# Patient Record
Sex: Female | Born: 1995 | Race: Black or African American | Hispanic: No | Marital: Single | Smoking: Never smoker
Health system: Southern US, Community
[De-identification: ages and names within clinical notes are randomized; demographics above are authoritative.]

## PROBLEM LIST (undated history)

## (undated) DIAGNOSIS — J45909 Unspecified asthma, uncomplicated: Secondary | ICD-10-CM

## (undated) DIAGNOSIS — IMO0002 Reserved for concepts with insufficient information to code with codable children: Secondary | ICD-10-CM

## (undated) DIAGNOSIS — N390 Urinary tract infection, site not specified: Secondary | ICD-10-CM

## (undated) DIAGNOSIS — F4310 Post-traumatic stress disorder, unspecified: Secondary | ICD-10-CM

## (undated) DIAGNOSIS — N939 Abnormal uterine and vaginal bleeding, unspecified: Secondary | ICD-10-CM

## (undated) DIAGNOSIS — T7840XA Allergy, unspecified, initial encounter: Secondary | ICD-10-CM

## (undated) DIAGNOSIS — E669 Obesity, unspecified: Secondary | ICD-10-CM

## (undated) DIAGNOSIS — F259 Schizoaffective disorder, unspecified: Secondary | ICD-10-CM

## (undated) HISTORY — PX: NO PAST SURGERIES: SHX2092

## (undated) HISTORY — DX: Post-traumatic stress disorder, unspecified: F43.10

## (undated) HISTORY — DX: Abnormal uterine and vaginal bleeding, unspecified: N93.9

## (undated) HISTORY — PX: WISDOM TOOTH EXTRACTION: SHX21

---
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
## 2015-11-14 ENCOUNTER — Encounter (HOSPITAL_COMMUNITY): Payer: Self-pay | Admitting: Emergency Medicine

## 2015-11-14 ENCOUNTER — Ambulatory Visit (HOSPITAL_COMMUNITY)
Admission: EM | Admit: 2015-11-14 | Discharge: 2015-11-14 | Disposition: A | Discharge status: Home / Self Care | Payer: Medicare Other

## 2015-11-14 DIAGNOSIS — B373 Candidiasis of vulva and vagina: Secondary | ICD-10-CM

## 2015-11-14 DIAGNOSIS — N898 Other specified noninflammatory disorders of vagina: Secondary | ICD-10-CM

## 2015-11-14 DIAGNOSIS — B3731 Acute candidiasis of vulva and vagina: Secondary | ICD-10-CM

## 2015-11-14 LAB — POCT PREGNANCY, URINE: PREG TEST UR: NEGATIVE

## 2015-11-14 LAB — POCT URINALYSIS DIP (DEVICE)
BILIRUBIN URINE: NEGATIVE
GLUCOSE, UA: NEGATIVE mg/dL
Hgb urine dipstick: NEGATIVE
KETONES UR: NEGATIVE mg/dL
Leukocytes, UA: NEGATIVE
Nitrite: NEGATIVE
Protein, ur: NEGATIVE mg/dL
SPECIFIC GRAVITY, URINE: 1.025 (ref 1.005–1.030)
Urobilinogen, UA: 0.2 mg/dL (ref 0.0–1.0)
pH: 6.5 (ref 5.0–8.0)

## 2015-11-14 MED ORDER — FLUCONAZOLE 200 MG PO TABS: 200.0000 mg | ORAL_TABLET | Freq: Every day | ORAL | 0 refills | Status: AC

## 2015-11-14 NOTE — ED Triage Notes (Signed)
The patient presented to the [facility] with a complaint of a vaginal discharge and lower back pain that started 3 days ago. The patient denied any dysuria.

## 2016-04-29 ENCOUNTER — Emergency Department (HOSPITAL_COMMUNITY)
Admission: EM | Admit: 2016-04-29 | Discharge: 2016-04-29 | Disposition: A | Discharge status: Home / Self Care | Payer: Medicare Other | Attending: Emergency Medicine | Admitting: Emergency Medicine

## 2016-04-29 ENCOUNTER — Encounter (HOSPITAL_COMMUNITY): Payer: Self-pay | Admitting: Emergency Medicine

## 2016-04-29 DIAGNOSIS — R519 Headache, unspecified: Secondary | ICD-10-CM

## 2016-04-29 DIAGNOSIS — J069 Acute upper respiratory infection, unspecified: Secondary | ICD-10-CM | POA: Diagnosis not present

## 2016-04-29 DIAGNOSIS — J45909 Unspecified asthma, uncomplicated: Secondary | ICD-10-CM | POA: Diagnosis not present

## 2016-04-29 DIAGNOSIS — Z79899 Other long term (current) drug therapy: Secondary | ICD-10-CM | POA: Diagnosis not present

## 2016-04-29 DIAGNOSIS — R51 Headache: Secondary | ICD-10-CM | POA: Diagnosis present

## 2016-04-29 LAB — CBC WITH DIFFERENTIAL/PLATELET
Basophils Absolute: 0 10*3/uL (ref 0.0–0.1)
Basophils Relative: 0 %
EOS ABS: 0.2 10*3/uL (ref 0.0–0.7)
Eosinophils Relative: 2 %
HEMATOCRIT: 33.6 % — AB (ref 36.0–46.0)
HEMOGLOBIN: 10.7 g/dL — AB (ref 12.0–15.0)
LYMPHS ABS: 2.6 10*3/uL (ref 0.7–4.0)
Lymphocytes Relative: 30 %
MCH: 23 pg — AB (ref 26.0–34.0)
MCHC: 31.8 g/dL (ref 30.0–36.0)
MCV: 72.3 fL — ABNORMAL LOW (ref 78.0–100.0)
MONO ABS: 0.4 10*3/uL (ref 0.1–1.0)
MONOS PCT: 4 %
NEUTROS ABS: 5.5 10*3/uL (ref 1.7–7.7)
NEUTROS PCT: 64 %
Platelets: 357 10*3/uL (ref 150–400)
RBC: 4.65 MIL/uL (ref 3.87–5.11)
RDW: 18.1 % — ABNORMAL HIGH (ref 11.5–15.5)
WBC: 8.6 10*3/uL (ref 4.0–10.5)

## 2016-04-29 LAB — I-STAT BETA HCG BLOOD, ED (MC, WL, AP ONLY): I-stat hCG, quantitative: <5 m[IU]/mL (ref <5)

## 2016-04-29 LAB — BASIC METABOLIC PANEL
Anion gap: 7 (ref 5–15)
BUN: 8 mg/dL (ref 6–20)
CHLORIDE: 105 mmol/L (ref 101–111)
CO2: 26 mmol/L (ref 22–32)
CREATININE: 0.84 mg/dL (ref 0.44–1.00)
Calcium: 8.6 mg/dL — ABNORMAL LOW (ref 8.9–10.3)
GFR calc Af Amer: >60 mL/min (ref >60)
GFR calc non Af Amer: >60 mL/min (ref >60)
Glucose, Bld: 101 mg/dL — ABNORMAL HIGH (ref 65–99)
Potassium: 3.7 mmol/L (ref 3.5–5.1)
Sodium: 138 mmol/L (ref 135–145)

## 2016-04-29 MED ORDER — METOCLOPRAMIDE HCL 5 MG/ML IJ SOLN
10.0000 mg | Freq: Once | INTRAMUSCULAR | Status: AC
  Administered 2016-04-29: 10 mg via INTRAVENOUS
  Filled 2016-04-29: qty 2

## 2016-04-29 MED ORDER — KETOROLAC TROMETHAMINE 30 MG/ML IJ SOLN
30.0000 mg | Freq: Once | INTRAMUSCULAR | Status: AC
  Administered 2016-04-29: 30 mg via INTRAVENOUS
  Filled 2016-04-29: qty 1

## 2016-04-29 MED ORDER — SODIUM CHLORIDE 0.9 % IV BOLUS (SEPSIS)
1000.0000 mL | Freq: Once | INTRAVENOUS | Status: AC
  Administered 2016-04-29: 1000 mL via INTRAVENOUS

## 2016-04-29 NOTE — Discharge Instructions (Signed)
Take ibuprofen 600 mg every 6 hours and/or tylenol 650 mg every 6 hours as needed for headache. You can also try some over the counter decongestants. Avoid taking these medications around the clock for more than 2-3 days, only take as needed  Return for worsening symptoms, including confusion, escalating pain, intractable vomiting or any other symptoms concerning to you.  This is likely a viral infection, which is usually worse first 3-5 days and get better over 1-2 week period.

## 2016-04-29 NOTE — ED Provider Notes (Signed)
[facility]-EMERGENCY DEPT Provider Note   CSN: [id number] Arrival date & time: 04/29/16  0915     History   Chief Complaint Chief Complaint  Patient presents with  . Headache    HPI Tricia Scott is a 21 y.o. female.  The history is provided by the patient.  Headache   This is a new problem. The current episode started 2 days ago. The problem occurs constantly. The problem has been gradually worsening. The headache is associated with nothing. The pain is located in the frontal region. The quality of the pain is described as sharp and throbbing. The pain is at a severity of 9/10. The pain is severe. The pain radiates to the face. Associated symptoms include nausea. Pertinent negatives include no anorexia, no fever, no malaise/fatigue, no chest pressure, no palpitations, no syncope, no shortness of breath and no vomiting. She has tried nothing for the symptoms.    21 year old female who presents with headache. Onset of headache 2 days ago, mild in nature and gradually worsening over the course of the past 2 days. Since he was nausea but no vomiting, no fevers, chills. Has had recent exposure to sick contacts with recent viral illness. She has had associating sinus congestion, mild nonproductive cough, mild sore throat, eye lateral ear fullness. No vision or speech symptoms, numbness or weakness.  Past Medical History:  Diagnosis Date  . Allergy   . Asthma   . Schizo-affective psychosis (HCC)   . Sprains and strains of ankle and foot 12/15/2011   Right side; occurred on Wednesday 12/12/2011  . Urinary tract infection     Patient Active Problem List   Diagnosis Date Noted  . 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 Generalized anxiety disorder 04/22/2011  . Oppositional defiant disorder 04/22/2011    Past Surgical History:  Procedure Laterality Date  . NO PAST SURGERIES      OB History    No data available       Home  Medications    Prior to Admission medications   Medication Sig Start Date End Date Taking? Authorizing Provider  albuterol (ACCUNEB) 0.63 MG/3ML nebulizer solution Take 1 ampule by nebulization every 6 (six) hours as needed for wheezing or shortness of breath.     Historical Provider, MD  Asenapine Maleate (SAPHRIS) 10 MG SUBL Place 10 mg under the tongue at bedtime.    Historical Provider, MD  Bisacodyl (LAXATIVE PO) Take by mouth.    Historical Provider, MD  cetirizine (ZYRTEC) 10 MG tablet Take 10 mg by mouth at bedtime.     Historical Provider, MD  haloperidol (HALDOL) 1 MG tablet Take 1 mg by mouth 2 (two) times daily.    Historical Provider, MD  lamoTRIgine (LAMICTAL) 150 MG tablet Take 150 mg by mouth at bedtime.    Historical Provider, MD  Melatonin 10 MG TABS Take 10 mg by mouth at bedtime.    Historical Provider, MD  Vilazodone HCl (VIIBRYD) 40 MG TABS Take 1 tablet (40 mg total) by mouth at bedtime. For anxiety and depression 04/27/11   Chauncey Mann, MD    Family History Family History  Problem Relation Age of Onset  . Diabetes Mother   . Diabetes type II Mother   . Fibromyalgia Mother   . Hypertension Mother   . Mental illness Father   . Mental illness Sister     Social History Social History  Substance Use Topics  . Smoking status:  Never Smoker  . Smokeless tobacco: Never Used  . Alcohol use No     Allergies   Aripiprazole; Risperidone; and Seroquel [quetiapine fumerate]   Review of Systems Review of Systems  Constitutional: Negative for fever and malaise/fatigue.  Respiratory: Negative for shortness of breath.   Cardiovascular: Negative for palpitations and syncope.  Gastrointestinal: Positive for nausea. Negative for anorexia and vomiting.  Neurological: Positive for headaches.  All other systems reviewed and are negative.    Physical Exam Updated Vital Signs BP 118/78 (BP Location: Left Arm)   Pulse 80   Temp 98.5 F (36.9 C) (Oral)   Resp 14    Ht 5' (1.524 m)   Wt 200 lb (90.7 kg)   SpO2 100%   BMI 39.06 kg/m   Physical Exam Physical Exam  Nursing note and vitals reviewed. Constitutional: Well developed, well nourished, non-toxic, and in no acute distress Head: Normocephalic and atraumatic.  Mouth/Throat: Oropharynx is clear and moist.  Ears: bilateral TMs full w/o effusion Neck: Normal range of motion. Neck supple. no meningismus Cardiovascular: Normal rate and regular rhythm.   Pulmonary/Chest: Effort normal and breath sounds normal.  Abdominal: Soft. There is no tenderness. There is no rebound and no guarding.  Musculoskeletal: Normal range of motion.  Neurological: Alert, no facial droop, fluent speech, moves all extremities, PERRL, sensation to light touch in tact throughout Skin: Skin is warm and dry. no rash Psychiatric: Cooperative   ED Treatments / Results  Labs (all labs ordered are listed, but only abnormal results are displayed) Labs Reviewed  CBC WITH DIFFERENTIAL/PLATELET - Abnormal; Notable for the following:       Result Value   Hemoglobin 10.7 (*)    HCT 33.6 (*)    MCV 72.3 (*)    MCH 23.0 (*)    RDW 18.1 (*)    All other components within normal limits  BASIC METABOLIC PANEL - Abnormal; Notable for the following:    Glucose, Bld 101 (*)    Calcium 8.6 (*)    All other components within normal limits  I-STAT BETA HCG BLOOD, ED ([facility], [facility], [facility] ONLY)    EKG  EKG Interpretation None       Radiology No results found.  Procedures Procedures (including critical care time)  Medications Ordered in ED Medications  sodium chloride 0.9 % bolus 1,000 mL (1,000 mLs Intravenous New Bag/Given 04/29/16 1229)  ketorolac (TORADOL) 30 MG/ML injection 30 mg (30 mg Intravenous Given 04/29/16 1229)  metoCLOPramide (REGLAN) injection 10 mg (10 mg Intravenous Given 04/29/16 1229)     Initial Impression / Assessment and Plan / ED Course  I have reviewed the triage vital signs and the nursing  notes.  Pertinent labs & imaging results that were available during my care of the patient were reviewed by me and considered in my medical decision making (see chart for details).  Clinical Course     21 year old female who presents with 2 days of headache in the setting of likely viral upper respiratory infection. No meningismus or fever to suggest meningitis. Headache not of sudden onset maximal intensity. Did not suddenly come on with exertion, cough, sneezing, but is made worse with cough. No suggestive of SAH. She is otherwise, non-toxic and in no acute distress. Neuro exam is in tact. Low suspicion for serious intracranial process at this time.  Headache gone after ivf, toradol, reglan. Blood work reassuring. Likely viral URI/sinus infection. Discussed supportive care.   Strict return and follow-up instructions reviewed. She  expressed understanding of all discharge instructions and felt comfortable with the plan of care.   Final Clinical Impressions(s) / ED Diagnoses   Final diagnoses:  Frontal headache  Viral upper respiratory infection    New Prescriptions New Prescriptions   No medications on file     Lavera Guiseana Duo Ariella Voit, MD 04/29/16 1315

## 2016-04-29 NOTE — ED Triage Notes (Addendum)
Pt c/o headache x 2 days worse with sneezing and laying flat, congestion onset last night, today feeling weakness. Friend told pt that she thought pt had the flu. No emesis, diarrhea. Sinuses tender to palpation. TMs cloudy. Conjunctiva diffusely pink.

## 2016-08-27 ENCOUNTER — Encounter (HOSPITAL_COMMUNITY): Payer: Self-pay | Admitting: Emergency Medicine

## 2016-08-27 ENCOUNTER — Emergency Department (HOSPITAL_COMMUNITY)
Admission: EM | Admit: 2016-08-27 | Discharge: 2016-08-27 | Disposition: A | Discharge status: Home / Self Care | Payer: No Typology Code available for payment source | Attending: Emergency Medicine | Admitting: Emergency Medicine

## 2016-08-27 DIAGNOSIS — Y999 Unspecified external cause status: Secondary | ICD-10-CM | POA: Insufficient documentation

## 2016-08-27 DIAGNOSIS — Y9389 Activity, other specified: Secondary | ICD-10-CM | POA: Diagnosis not present

## 2016-08-27 DIAGNOSIS — Y92410 Unspecified street and highway as the place of occurrence of the external cause: Secondary | ICD-10-CM | POA: Insufficient documentation

## 2016-08-27 DIAGNOSIS — S161XXA Strain of muscle, fascia and tendon at neck level, initial encounter: Secondary | ICD-10-CM

## 2016-08-27 DIAGNOSIS — S199XXA Unspecified injury of neck, initial encounter: Secondary | ICD-10-CM | POA: Diagnosis present

## 2016-08-27 DIAGNOSIS — J45909 Unspecified asthma, uncomplicated: Secondary | ICD-10-CM | POA: Diagnosis not present

## 2016-08-27 HISTORY — DX: Obesity, unspecified: E66.9

## 2016-08-27 MED ORDER — NAPROXEN 500 MG PO TABS: 500.0000 mg | ORAL_TABLET | Freq: Two times a day (BID) | ORAL | 0 refills | Status: DC | PRN

## 2016-08-27 MED ORDER — METHOCARBAMOL 500 MG PO TABS: 500.0000 mg | ORAL_TABLET | Freq: Two times a day (BID) | ORAL | 0 refills | Status: DC | PRN
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
## 2017-05-08 ENCOUNTER — Ambulatory Visit (HOSPITAL_COMMUNITY)
Admission: EM | Admit: 2017-05-08 | Discharge: 2017-05-08 | Disposition: A | Discharge status: Home / Self Care | Payer: Medicare Other | Attending: Family Medicine | Admitting: Family Medicine

## 2017-05-08 ENCOUNTER — Encounter (HOSPITAL_COMMUNITY): Payer: Self-pay | Admitting: Emergency Medicine

## 2017-05-08 ENCOUNTER — Other Ambulatory Visit: Payer: Self-pay

## 2017-05-08 DIAGNOSIS — J069 Acute upper respiratory infection, unspecified: Secondary | ICD-10-CM | POA: Diagnosis not present

## 2017-05-08 MED ORDER — CROMOLYN SODIUM 5.2 MG/ACT NA AERS: 1.0000 | INHALATION_SPRAY | Freq: Four times a day (QID) | NASAL | 12 refills | Status: DC

## 2017-05-08 MED ORDER — ACETAMINOPHEN 325 MG PO TABS
ORAL_TABLET | ORAL | Status: AC
  Filled 2017-05-08: qty 2

## 2017-05-08 MED ORDER — ACETAMINOPHEN 500 MG PO TABS: 500.0000 mg | ORAL_TABLET | Freq: Four times a day (QID) | ORAL | 0 refills | Status: DC | PRN

## 2017-05-08 MED ORDER — ACETAMINOPHEN 325 MG PO TABS
650.0000 mg | ORAL_TABLET | Freq: Once | ORAL | Status: AC
  Administered 2017-05-08: 650 mg via ORAL

## 2017-05-08 NOTE — Discharge Instructions (Signed)
Push fluids to ensure adequate hydration and keep secretions thin.  Tylenol and/or ibuprofen as needed for pain or fevers.  Nasal spray up to 4 times a day for congestion symptoms.  May take daily zyrtec as well.  If symptoms worsen or do not improve in the next week to return to be seen or to follow up with your PCP.

## 2017-05-08 NOTE — ED Triage Notes (Signed)
Pt reports nasal congestion, nasal drainage, sore throat and drainage from her ears bilaterally.  She also reports cold chills and night sweats, nausea and some abdominal discomfort.  Pt has not taken any OTC medications for her symptoms.

## 2017-05-08 NOTE — ED Provider Notes (Signed)
[facility]    CSN: [id number] Arrival date & time: 05/08/17  1012     History   Chief Complaint Chief Complaint  Patient presents with  . URI    HPI [facility] is a 22 y.o. female.   [name] presents with complaints of nasal drainage occasional abdominal pain and nausea with decreased appetite, subjective fevers, mild headache, ear drainage which started 4 days ago. Without cough, sore throat, vomiting, diarrhea, known ill contacts, rash. Has not taken any medications for symptoms. History of asthma as well as anxiety/depression, takes daily viibryd.    ROS per HPI.       Past Medical History:  Diagnosis Date  . Allergy   . Asthma   . Obesity   . Schizo-affective psychosis (HCC)   . Sprains and strains of ankle and foot 12/15/2011   Right side; occurred on Wednesday 12/12/2011  . Urinary tract infection     Patient Active Problem List   Diagnosis Date Noted  . 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 Generalized anxiety disorder 04/22/2011  . Oppositional defiant disorder 04/22/2011    Past Surgical History:  Procedure Laterality Date  . NO PAST SURGERIES      OB History    No data available       Home Medications    Prior to Admission medications   Medication Sig Start Date End Date Taking? Authorizing Provider  Vilazodone HCl (VIIBRYD) 40 MG TABS Take 1 tablet (40 mg total) by mouth at bedtime. For anxiety and depression 04/27/11  Yes Chauncey Mann, MD  acetaminophen (TYLENOL) 500 MG tablet Take 1 tablet (500 mg total) by mouth every 6 (six) hours as needed. 05/08/17   Georgetta Haber, NP  albuterol (ACCUNEB) 0.63 MG/3ML nebulizer solution Take 1 ampule by nebulization every 6 (six) hours as needed for wheezing or shortness of breath.     [provider]  Asenapine Maleate (SAPHRIS) 10 MG SUBL Place 10 mg under the tongue at bedtime.    [provider]  Bisacodyl  (LAXATIVE PO) Take by mouth.    [provider]  cetirizine (ZYRTEC) 10 MG tablet Take 10 mg by mouth at bedtime.     [provider]  cromolyn (NASALCROM) 5.2 MG/ACT nasal spray Place 1 spray into both nostrils 4 (four) times daily. 05/08/17   Linus Mako B, NP  haloperidol (HALDOL) 1 MG tablet Take 1 mg by mouth 2 (two) times daily.    [provider]  lamoTRIgine (LAMICTAL) 150 MG tablet Take 150 mg by mouth at bedtime.    [provider]  Melatonin 10 MG TABS Take 10 mg by mouth at bedtime.    [provider]    Family History Family History  Problem Relation Age of Onset  . Diabetes Mother   . Diabetes type II Mother   . Fibromyalgia Mother   . Hypertension Mother   . Mental illness Father   . Mental illness Sister     Social History Social History   Tobacco Use  . Smoking status: Never Smoker  . Smokeless tobacco: Never Used  Substance Use Topics  . Alcohol use: No  . Drug use: No     Allergies   Aripiprazole; Risperidone; and Seroquel [quetiapine fumerate]   Review of Systems Review of Systems   Physical Exam Triage Vital Signs ED Triage Vitals  Enc Vitals Group     BP 05/08/17 1041  121/89     Pulse Rate 05/08/17 1041 72     Resp --      Temp 05/08/17 1041 98.2 F (36.8 C)     Temp Source 05/08/17 1041 Oral     SpO2 05/08/17 1041 100 %     Weight --      Height --      Head Circumference --      Peak Flow --      Pain Score 05/08/17 1036 5     Pain Loc --      Pain Edu? --      Excl. in GC? --    No data found.  Updated Vital Signs BP 121/89 (BP Location: Left Arm)   Pulse 72   Temp 98.2 F (36.8 C) (Oral)   LMP 05/01/2017 (Approximate)   SpO2 100%   Visual Acuity Right Eye Distance:   Left Eye Distance:   Bilateral Distance:    Right Eye Near:   Left Eye Near:    Bilateral Near:     Physical Exam  Constitutional: She is oriented to person, place, and time. She appears well-developed  and well-nourished. No distress.  HENT:  Head: Normocephalic and atraumatic.  Right Ear: Tympanic membrane, external ear and ear canal normal.  Left Ear: External ear and ear canal normal. Tympanic membrane is erythematous.  Nose: Nose normal.  Mouth/Throat: Uvula is midline, oropharynx is clear and moist and mucous membranes are normal. No tonsillar exudate.  Mild erythema to left TM; without drainage or perforation to either TM   Eyes: Conjunctivae and EOM are normal. Pupils are equal, round, and reactive to light.  Cardiovascular: Normal rate, regular rhythm and normal heart sounds.  Pulmonary/Chest: Effort normal and breath sounds normal.  Abdominal: Soft. There is no tenderness.  Lymphadenopathy:    She has no cervical adenopathy.  Neurological: She is alert and oriented to person, place, and time.  Skin: Skin is warm and dry.     UC Treatments / Results  Labs (all labs ordered are listed, but only abnormal results are displayed) Labs Reviewed - No data to display  EKG  EKG Interpretation None       Radiology No results found.  Procedures Procedures (including critical care time)  Medications Ordered in UC Medications  acetaminophen (TYLENOL) tablet 650 mg (not administered)     Initial Impression / Assessment and Plan / UC Course  I have reviewed the triage vital signs and the nursing notes.  Pertinent labs & imaging results that were available during my care of the patient were reviewed by me and considered in my medical decision making (see chart for details).     Benign physical findings on exam. Non toxic in appearance, vitals stable. History and physical consistent with viral illness.  Supportive cares recommended. If symptoms worsen or do not improve in the next week to return to be seen or to follow up with PCP.  Patient verbalized understanding and agreeable to plan.    Final Clinical Impressions(s) / UC Diagnoses   Final diagnoses:  Viral upper  respiratory tract infection    ED Discharge Orders        Ordered    cromolyn (NASALCROM) 5.2 MG/ACT nasal spray  4 times daily     05/08/17 1106    acetaminophen (TYLENOL) 500 MG tablet  Every 6 hours PRN     05/08/17 1106       Controlled Substance Prescriptions [place] Controlled Substance Registry consulted?  Not Applicable   Georgetta HaberBurky, Natalie B, NP 05/08/17 [phone]

## 2018-01-12 ENCOUNTER — Encounter (HOSPITAL_COMMUNITY): Payer: Self-pay | Admitting: Emergency Medicine

## 2018-01-12 ENCOUNTER — Emergency Department (HOSPITAL_COMMUNITY)
Admission: EM | Admit: 2018-01-12 | Discharge: 2018-01-12 | Disposition: A | Discharge status: Home / Self Care | Payer: Medicare Other | Attending: Emergency Medicine | Admitting: Emergency Medicine

## 2018-01-12 ENCOUNTER — Other Ambulatory Visit: Payer: Self-pay

## 2018-01-12 DIAGNOSIS — J45909 Unspecified asthma, uncomplicated: Secondary | ICD-10-CM | POA: Insufficient documentation

## 2018-01-12 DIAGNOSIS — R599 Enlarged lymph nodes, unspecified: Secondary | ICD-10-CM | POA: Insufficient documentation

## 2018-01-12 DIAGNOSIS — J069 Acute upper respiratory infection, unspecified: Secondary | ICD-10-CM | POA: Diagnosis not present

## 2018-01-12 DIAGNOSIS — Z79899 Other long term (current) drug therapy: Secondary | ICD-10-CM | POA: Insufficient documentation

## 2018-01-12 DIAGNOSIS — R0981 Nasal congestion: Secondary | ICD-10-CM | POA: Diagnosis present

## 2018-01-12 MED ORDER — IBUPROFEN 400 MG PO TABS
600.0000 mg | ORAL_TABLET | Freq: Once | ORAL | Status: AC
  Administered 2018-01-12: 600 mg via ORAL
  Filled 2018-01-12: qty 1

## 2018-01-12 MED ORDER — IBUPROFEN 600 MG PO TABS: 600.0000 mg | ORAL_TABLET | Freq: Four times a day (QID) | ORAL | 0 refills | Status: DC | PRN

## 2018-01-12 MED ORDER — ACETAMINOPHEN 500 MG PO TABS: 500.0000 mg | ORAL_TABLET | Freq: Four times a day (QID) | ORAL | 0 refills | Status: AC | PRN

## 2018-01-12 NOTE — ED Triage Notes (Addendum)
Pt ambulatory to triage via ems c/o pain and ? Knot on left neck area states has been there for a week , sore to touch she states , states it moves aaround a lot she states,  It  She states gives her h/a

## 2018-01-12 NOTE — ED Provider Notes (Signed)
[facility] EMERGENCY DEPARTMENT Provider Note   CSN: [id number] Arrival date & time: 01/12/18  1230     History   Chief Complaint Chief Complaint  Patient presents with  . Torticollis    HPI Farah A Marquis is a 22 y.o. female with history of obesity, asthma, schizoaffective psychosis who presents with a one-week history of nasal congestion, intermittent ear pain, subjective fevers, and a knot to her left anterior shoulder.  She reports not has been painful and radiates pain to her left shoulder.  She has been taking Zyrtec and Tylenol at home.  She is concerned about the knot, however.  Patient denies any chest pain, shortness of breath, abdominal pain, vomiting.  Patient has had some intermittent nausea.  HPI  Past Medical History:  Diagnosis Date  . Allergy   . Asthma   . Obesity   . Schizo-affective psychosis (HCC)   . Sprains and strains of ankle and foot 12/15/2011   Right side; occurred on Wednesday 12/12/2011  . Urinary tract infection     Patient Active Problem List   Diagnosis Date Noted  . 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 Generalized anxiety disorder 04/22/2011  . Oppositional defiant disorder 04/22/2011    Past Surgical History:  Procedure Laterality Date  . NO PAST SURGERIES       OB History   None      Home Medications    Prior to Admission medications   Medication Sig Start Date End Date Taking? Authorizing Provider  acetaminophen (TYLENOL) 500 MG tablet Take 1 tablet (500 mg total) by mouth every 6 (six) hours as needed. 01/12/18   Lecia Esperanza, Waylan Boga, PA-C  albuterol (ACCUNEB) 0.63 MG/3ML nebulizer solution Take 1 ampule by nebulization every 6 (six) hours as needed for wheezing or shortness of breath.     [provider]  Asenapine Maleate (SAPHRIS) 10 MG SUBL Place 10 mg under the tongue at bedtime.    [provider]  Bisacodyl (LAXATIVE PO) Take by  mouth.    [provider]  cetirizine (ZYRTEC) 10 MG tablet Take 10 mg by mouth at bedtime.     [provider]  cromolyn (NASALCROM) 5.2 MG/ACT nasal spray Place 1 spray into both nostrils 4 (four) times daily. 05/08/17   Linus Mako B, NP  haloperidol (HALDOL) 1 MG tablet Take 1 mg by mouth 2 (two) times daily.    [provider]  ibuprofen (ADVIL,MOTRIN) 600 MG tablet Take 1 tablet (600 mg total) by mouth every 6 (six) hours as needed. 01/12/18   Jair Lindblad, Waylan Boga, PA-C  lamoTRIgine (LAMICTAL) 150 MG tablet Take 150 mg by mouth at bedtime.    [provider]  Melatonin 10 MG TABS Take 10 mg by mouth at bedtime.    [provider]  Vilazodone HCl (VIIBRYD) 40 MG TABS Take 1 tablet (40 mg total) by mouth at bedtime. For anxiety and depression 04/27/11   Chauncey Mann, MD    Family History Family History  Problem Relation Age of Onset  . Diabetes Mother   . Diabetes type II Mother   . Fibromyalgia Mother   . Hypertension Mother   . Mental illness Father   . Mental illness Sister     Social History Social History   Tobacco Use  . Smoking status: Never Smoker  . Smokeless tobacco: Never Used  Substance Use Topics  . Alcohol use: No  . Drug  use: No     Allergies   Aripiprazole; Risperidone; and Seroquel [quetiapine fumerate]   Review of Systems Review of Systems  Constitutional: Positive for fever.  HENT: Positive for congestion and ear pain. Negative for sore throat.   Respiratory: Negative for cough and shortness of breath.   Cardiovascular: Negative for chest pain.  Gastrointestinal: Positive for nausea. Negative for abdominal pain and vomiting.  Hematological: Positive for adenopathy.     Physical Exam Updated Vital Signs BP (!) 144/93 (BP Location: Right Arm)   Pulse 98   Temp 98.9 F (37.2 C) (Oral)   Resp 16   SpO2 100%   Physical Exam  Constitutional: She appears well-developed and well-nourished. No  distress.  HENT:  Head: Normocephalic and atraumatic.  Right Ear: Tympanic membrane normal.  Left Ear: Tympanic membrane normal.  Mouth/Throat: Oropharynx is clear and moist and mucous membranes are normal. No oropharyngeal exudate, posterior oropharyngeal edema, posterior oropharyngeal erythema or tonsillar abscesses. Tonsils are 0 on the right. Tonsils are 0 on the left. No tonsillar exudate.  Eyes: Pupils are equal, round, and reactive to light. Conjunctivae are normal. Right eye exhibits no discharge. Left eye exhibits no discharge. No scleral icterus.  Neck: Normal range of motion. Neck supple. No thyromegaly present.    Cardiovascular: Normal rate, regular rhythm, normal heart sounds and intact distal pulses. Exam reveals no gallop and no friction rub.  No murmur heard. Pulmonary/Chest: Effort normal and breath sounds normal. No stridor. No respiratory distress. She has no wheezes. She has no rales.  Abdominal: Soft. Bowel sounds are normal. She exhibits no distension. There is no tenderness. There is no rebound and no guarding.  Musculoskeletal: She exhibits no edema.  Neurological: She is alert. Coordination normal.  Skin: Skin is warm and dry. No rash noted. She is not diaphoretic. No pallor.  Psychiatric: She has a normal mood and affect.  Nursing note and vitals reviewed.    ED Treatments / Results  Labs (all labs ordered are listed, but only abnormal results are displayed) Labs Reviewed - No data to display  EKG None  Radiology No results found.  Procedures Procedures (including critical care time)  Medications Ordered in ED Medications  ibuprofen (ADVIL,MOTRIN) tablet 600 mg (has no administration in time range)     Initial Impression / Assessment and Plan / ED Course  I have reviewed the triage vital signs and the nursing notes.  Pertinent labs & imaging results that were available during my care of the patient were reviewed by me and considered in my  medical decision making (see chart for details).     Patient with tender lymph node, most likely related to patient's URI.  Most likely viral.  Will treat symptomatically at this time with Tylenol, ibuprofen, Zyrtec, heat/ice.  Patient advised to follow-up with PCP or community [facility] wellness center for further evaluation if the lymph node persists after URI symptoms have resolved.  No signs of lymphangitis at this time.  No erythema.  Return precautions discussed.  Patient understands and agrees with plan.  Patient vitals stable throughout ED course and discharged in satisfactory condition.  Final Clinical Impressions(s) / ED Diagnoses   Final diagnoses:  Enlarged lymph node  Viral URI    ED Discharge Orders         Ordered    ibuprofen (ADVIL,MOTRIN) 600 MG tablet  Every 6 hours PRN     01/12/18 1327    acetaminophen (TYLENOL) 500 MG tablet  Every  6 hours PRN     01/12/18 1327           Emi Holes, PA-C 01/12/18 1331    Loren Racer, MD 01/12/18 1550

## 2018-01-12 NOTE — Discharge Instructions (Addendum)
I suspect that you have an enlarged lymph node from the viral upper respiratory infection that should run its course in 7 to 10 days.  Continue taking Zyrtec as prescribed.  You can alternate ibuprofen and Tylenol as prescribed for your pain.  Make sure to take these medications with food. You can use a heating pad over the area to help provide comfort as well.  If your upper respiratory symptoms, such as nasal congestion and ear pain, are resolving and the area on your shoulder is not improving, please follow-up with your primary care doctor or the [facility] for further evaluation and treatment.

## 2018-07-04 ENCOUNTER — Ambulatory Visit: Payer: Medicare Other | Admitting: Internal Medicine

## 2018-09-22 ENCOUNTER — Ambulatory Visit: Payer: Medicare Other | Admitting: Nurse Practitioner

## 2018-09-24 ENCOUNTER — Other Ambulatory Visit: Payer: Self-pay

## 2018-09-24 ENCOUNTER — Encounter: Payer: Self-pay | Admitting: Nurse Practitioner

## 2018-09-24 ENCOUNTER — Ambulatory Visit: Payer: Medicare Other | Attending: Nurse Practitioner | Admitting: Nurse Practitioner

## 2018-09-24 DIAGNOSIS — Z87440 Personal history of urinary (tract) infections: Secondary | ICD-10-CM | POA: Insufficient documentation

## 2018-09-24 DIAGNOSIS — N939 Abnormal uterine and vaginal bleeding, unspecified: Secondary | ICD-10-CM | POA: Diagnosis not present

## 2018-09-24 DIAGNOSIS — J3089 Other allergic rhinitis: Secondary | ICD-10-CM | POA: Diagnosis not present

## 2018-09-24 DIAGNOSIS — Z8249 Family history of ischemic heart disease and other diseases of the circulatory system: Secondary | ICD-10-CM | POA: Diagnosis not present

## 2018-09-24 DIAGNOSIS — Z6841 Body Mass Index (BMI) 40.0 and over, adult: Secondary | ICD-10-CM | POA: Diagnosis not present

## 2018-09-24 DIAGNOSIS — F4310 Post-traumatic stress disorder, unspecified: Secondary | ICD-10-CM | POA: Diagnosis not present

## 2018-09-24 DIAGNOSIS — F331 Major depressive disorder, recurrent, moderate: Secondary | ICD-10-CM

## 2018-09-24 DIAGNOSIS — J45909 Unspecified asthma, uncomplicated: Secondary | ICD-10-CM | POA: Diagnosis not present

## 2018-09-24 DIAGNOSIS — Z818 Family history of other mental and behavioral disorders: Secondary | ICD-10-CM | POA: Insufficient documentation

## 2018-09-24 DIAGNOSIS — Z833 Family history of diabetes mellitus: Secondary | ICD-10-CM | POA: Diagnosis not present

## 2018-09-24 DIAGNOSIS — Z7689 Persons encountering health services in other specified circumstances: Secondary | ICD-10-CM

## 2018-09-24 MED ORDER — CETIRIZINE HCL 10 MG PO TABS: 10.0000 mg | ORAL_TABLET | Freq: Every day | ORAL | 2 refills | Status: DC

## 2018-09-24 MED ORDER — IBUPROFEN 600 MG PO TABS: 600.0000 mg | ORAL_TABLET | Freq: Four times a day (QID) | ORAL | 0 refills | Status: AC | PRN

## 2018-09-24 MED ORDER — CROMOLYN SODIUM 5.2 MG/ACT NA AERS: 1.0000 | INHALATION_SPRAY | Freq: Four times a day (QID) | NASAL | 12 refills | Status: DC

## 2018-09-24 NOTE — Progress Notes (Signed)
80-year virtual Visit via Telephone Note Due to national recommendations of social distancing due to [facility], telehealth visit is felt to be most appropriate for this patient at this time.  I discussed the limitations, risks, security and privacy concerns of performing an evaluation and management service by telephone and the availability of in person appointments. I also discussed with the patient that there may be a patient responsible charge related to this service. The patient expressed understanding and agreed to proceed.    I connected with T[facility] Scott on 09/24/18  at   1:50 PM EDT  EDT by telephone and verified that I am speaking with the correct person using two identifiers.   Consent I discussed the limitations, risks, security and privacy concerns of performing an evaluation and management service by telephone and the availability of in person appointments. I also discussed with the patient that there may be a patient responsible charge related to this service. The patient expressed understanding and agreed to proceed.   Location of Patient: Private  Residence   Location of Provider: [place] [facility] [employer] [facility]    Persons participating in Telemedicine visit: Tricia Rankins FNP-BC [facility]    History of Present Illness: Telemedicine visit for: Establish Care  has a past medical history of Allergy, Asthma, Obesity, PTSD (post-traumatic stress disorder), Schizo-affective psychosis ([facility]), Sprains and strains of ankle and foot (12/15/2011), and Urinary tract infection.    She is a current patient at [facility]. She sees a psychiatrist every 3 months and the therapist every other week.   She has not seen a primary care provider since 2018. Last PAP smear was 2018 and normal per her report. She is not sexually active.  She is requesting a referral to gynecology for recommendations regarding birth control and relief of her  menorrhagia  Asthma  Chronic and well controlled. Sparing use of inhaler.  There is no [facility] Scott on her medication profile.  However she reports to me that she just recently picked up a Tricia inhaler.  She will call back with the name of her [facility].  Aggravating factors to asthma exacerbation: Temperature changes, pollen, dust.    AUB She endorses menorrhagia which has worsened over the past several months. Started her menstrual cycle at the age of 46. Her cycle is 5 days.  Maternal history of mother having fibroids.  Heaviest bleeding on days 1-3. She is not sexually active.  Past Medical History:  Diagnosis Date  . Allergy   . Asthma   . Obesity   . PTSD (post-traumatic stress disorder)   . Schizo-affective psychosis ([facility])   . Sprains and strains of ankle and foot 12/15/2011   Right side; occurred on Wednesday 12/12/2011  . Urinary tract infection     Past Surgical History:  Procedure Laterality Date  . NO PAST SURGERIES    . wisdom tooth removal      Family History  Problem Relation Age of Onset  . Diabetes Mother   . Diabetes type II Mother   . Fibromyalgia Mother   . Hypertension Mother   . Mental illness Father   . Mental illness Sister     Social History   Socioeconomic History  . Marital status: Single    Spouse name: Not on file  . Number of children: Not on file  . Years of education: Not on file  . Highest education level: Not on file  Occupational History  .  Not on file  Social Needs  . Financial resource strain: Not on file  . Food insecurity:    Worry: Not on file    Inability: Not on file  . Transportation needs:    Medical: Not on file    Non-medical: Not on file  Tobacco Use  . Smoking status: Never Smoker  . Smokeless tobacco: Never Used  Substance and Sexual Activity  . Alcohol use: No  . Drug use: No  . Sexual activity: Never    Birth control/protection: None  Lifestyle  . Physical activity:    Days per week: Not on file    Minutes per session:  Not on file  . Stress: Not on file  Relationships  . Social connections:    Talks on phone: Not on file    Gets together: Not on file    Attends religious service: Not on file    Active member of club or organization: Not on file    Attends meetings of clubs or organizations: Not on file    Relationship status: Not on file  Other Topics Concern  . Not on file  Social History Narrative  . Not on file     Observations/Objective: Awake, alert and oriented x 3   Review of Systems  Constitutional: Negative for fever, malaise/fatigue and weight loss.  HENT: Negative.  Negative for nosebleeds.   Eyes: Negative.  Negative for blurred vision, double vision and photophobia.  Respiratory: Negative.  Negative for cough, shortness of breath and wheezing.   Cardiovascular: Negative.  Negative for chest pain, palpitations and leg swelling.  Gastrointestinal: Negative.  Negative for heartburn, nausea and vomiting.  Genitourinary:       AUB  Musculoskeletal: Negative.  Negative for myalgias.  Neurological: Negative.  Negative for dizziness, focal weakness, seizures and headaches.  Endo/Heme/Allergies: Positive for environmental allergies.  Psychiatric/Behavioral: Positive for depression. Negative for substance abuse and suicidal ideas. The patient is nervous/anxious.     Assessment and Plan: Shadow was evaluated today for [facility] patient (initial visit).  Diagnoses and all orders for this visit:  Encounter to establish care  Abnormal uterine bleeding (AUB) -     Ambulatory referral to Gynecology -     ibuprofen (ADVIL) 600 MG tablet; Take 1 tablet (600 mg total) by mouth every 6 (six) hours as needed for headache or cramping.  Moderate recurrent major depression (HCC) [facility]   Allergic rhinitis due to other allergic trigger, unspecified seasonality -     cromolyn (NASALCROM) 5.2 MG/ACT nasal spray; Place 1 spray into both nostrils 4 (four)  times daily. -     cetirizine (ZYRTEC) 10 MG tablet; Take 1 tablet (10 mg total) by mouth at bedtime.   Morbid Obesity Discussed diet and exercise for person with BMI >40. Instructed: You must burn more calories than you eat. Losing 5 percent of your body weight should be considered a success. In the longer term, losing more than 15 percent of your body weight and staying at this weight is an extremely good result. However, keep in mind that even losing 5 percent of your body weight leads to important health benefits, so try not to get discouraged if you're not able to lose more than this. Will recheck weight in 3-6 months.  Follow Up Instructions Return in about 2 months (around 11/24/2018).     I discussed the assessment and treatment plan with the patient. The patient was provided an opportunity  to ask questions and all were answered. The patient agreed with the plan and demonstrated an understanding of the instructions.   The patient was advised to call back or seek an in-person evaluation if the symptoms worsen or if the condition fails to improve as anticipated.  I provided 20 minutes of non-face-to-face time during this encounter including median intraservice time, reviewing previous notes, labs, imaging, medications and explaining diagnosis and management.  Claiborne RiggZelda W Cheree Fowles, FNP-BC

## 2018-10-29 ENCOUNTER — Encounter: Payer: Self-pay | Admitting: Obstetrics & Gynecology

## 2018-10-29 ENCOUNTER — Telehealth (INDEPENDENT_AMBULATORY_CARE_PROVIDER_SITE_OTHER): Payer: Medicare Other | Admitting: Obstetrics & Gynecology

## 2018-10-29 ENCOUNTER — Other Ambulatory Visit: Payer: Self-pay

## 2018-10-29 DIAGNOSIS — N920 Excessive and frequent menstruation with regular cycle: Secondary | ICD-10-CM

## 2018-10-29 NOTE — Progress Notes (Signed)
I connected with  Tricia Scott on 10/29/18 at  3:35 PM EDT by telephone and verified that I am speaking with the correct person using two identifiers.   I discussed the limitations, risks, security and privacy concerns of performing an evaluation and management service by telephone and the availability of in person appointments. I also discussed with the patient that there may be a patient responsible charge related to this service. The patient expressed understanding and agreed to proceed.  Derinda Late, RN 10/29/2018  3:30 PM

## 2018-10-29 NOTE — Progress Notes (Signed)
    TELEHEALTH GYNECOLOGY VIRTUAL VIDEO VISIT ENCOUNTER NOTE  Provider location: [facility] at [facility]   I connected with Tricia Scott on 10/29/18 at  3:35 PM EDT by MyChart Video Encounter at home and verified that I am speaking with the correct person using two identifiers.   I discussed the limitations, risks, security and privacy concerns of performing an evaluation and management service virtually and the availability of in person appointments. I also discussed with the patient that there may be a patient responsible charge related to this service. The patient expressed understanding and agreed to proceed.   History:  Tricia Scott is a 23 y.o. single G18female being evaluated today for heavy painful periods. This    has gotten progressively worse since about March 2020. Her periods last about 7 days, menarche about 48 or 23 years old. Cramping bad started around 24 years old. Periods are generally monthly. When she has the pain she has tried tylenol and IBU. This helps some.    Past Medical History:  Diagnosis Date  . Allergy   . Asthma   . Obesity   . PTSD (post-traumatic stress disorder)   . Schizo-affective psychosis ([place])   . Sprains and strains of ankle and foot 12/15/2011   Right side; occurred on Wednesday 12/12/2011  . Urinary tract infection    Past Surgical History:  Procedure Laterality Date  . NO PAST SURGERIES    . wisdom tooth removal     The following portions of the patient's history were reviewed and updated as appropriate: allergies, current medications, past family history, past medical history, past social history, past surgical history and problem list.     Review of Systems:  Pertinent items noted in HPI and remainder of comprehensive ROS otherwise negative.  Physical Exam:   General:  Alert, oriented and cooperative. Patient appears to be in no acute distress.  Mental Status: Normal mood and affect. Normal behavior. Normal  judgment and thought content.   Respiratory: Normal respiratory effort, no problems with respiration noted  Rest of physical exam deferred due to type of encounter  Labs and Imaging No results found for this or any previous visit (from the past 336 hour(s)). No results found.     Assessment and Plan:     1. Menorrhagia with regular cycle  - US PELVIS TRANSVAGINAL NON-OB (TV ONLY); Future - CBC; Future - TSH; Future     She will have an in person visit when u/s results available Will need a pap smear at that time Offer Gardasil then.   I discussed the assessment and treatment plan with the patient. The patient was provided an opportunity to ask questions and all were answered. The patient agreed with the plan and demonstrated an understanding of the instructions.   The patient was advised to call back or seek an in-person evaluation/go to the ED if the symptoms worsen or if the condition fails to improve as anticipated.  I provided 10 minutes of face-to-face time during this encounter.   Emily Filbert, MD [facility], [place]

## 2018-10-30 ENCOUNTER — Telehealth: Payer: Self-pay | Admitting: Obstetrics & Gynecology

## 2018-10-30 NOTE — Telephone Encounter (Signed)
Called the patient to inform of the upcoming visit. Also informed the patient of the appointment tomorrow as well as the covid19 restrictions. The patient stated she does not have any symptoms.

## 2018-10-31 ENCOUNTER — Other Ambulatory Visit: Payer: Self-pay

## 2018-10-31 ENCOUNTER — Other Ambulatory Visit: Payer: Medicare Other

## 2018-10-31 DIAGNOSIS — N920 Excessive and frequent menstruation with regular cycle: Secondary | ICD-10-CM

## 2018-11-01 LAB — CBC
Hematocrit: 31.3 % — ABNORMAL LOW (ref 34.0–46.6)
Hemoglobin: 8.7 g/dL — ABNORMAL LOW (ref 11.1–15.9)
MCH: 20.2 pg — ABNORMAL LOW (ref 26.6–33.0)
MCHC: 27.8 g/dL — ABNORMAL LOW (ref 31.5–35.7)
MCV: 73 fL — ABNORMAL LOW (ref 79–97)
Platelets: 358 10*3/uL (ref 150–450)
RBC: 4.31 x10E6/uL (ref 3.77–5.28)
RDW: 18.7 % — ABNORMAL HIGH (ref 11.7–15.4)
WBC: 9.4 10*3/uL (ref 3.4–10.8)

## 2018-11-01 LAB — TSH: TSH: 1.34 u[IU]/mL (ref 0.450–4.500)

## 2018-11-03 ENCOUNTER — Other Ambulatory Visit: Payer: Self-pay | Admitting: Obstetrics & Gynecology

## 2018-11-03 ENCOUNTER — Telehealth: Payer: Self-pay | Admitting: Obstetrics & Gynecology

## 2018-11-03 MED ORDER — MEGESTROL ACETATE 40 MG PO TABS: 40.0000 mg | ORAL_TABLET | Freq: Two times a day (BID) | ORAL | 5 refills | Status: DC

## 2018-11-03 NOTE — Progress Notes (Signed)
I spoke with her and told her that she needs fereheme x2. I ordered this.

## 2018-11-03 NOTE — Progress Notes (Signed)
Megace ordered

## 2018-11-03 NOTE — Telephone Encounter (Signed)
I called her and explained her anemia and ordered fereheme x 2.

## 2018-11-06 ENCOUNTER — Other Ambulatory Visit: Payer: Self-pay

## 2018-11-06 ENCOUNTER — Emergency Department (HOSPITAL_COMMUNITY): Payer: Medicare Other

## 2018-11-06 ENCOUNTER — Telehealth: Payer: Self-pay | Admitting: Obstetrics & Gynecology

## 2018-11-06 ENCOUNTER — Emergency Department (HOSPITAL_COMMUNITY)
Admission: EM | Admit: 2018-11-06 | Discharge: 2018-11-06 | Disposition: A | Discharge status: Home / Self Care | Payer: Medicare Other | Attending: Emergency Medicine | Admitting: Emergency Medicine

## 2018-11-06 ENCOUNTER — Telehealth: Payer: Self-pay | Admitting: General Practice

## 2018-11-06 DIAGNOSIS — D500 Iron deficiency anemia secondary to blood loss (chronic): Secondary | ICD-10-CM | POA: Insufficient documentation

## 2018-11-06 DIAGNOSIS — R0789 Other chest pain: Secondary | ICD-10-CM | POA: Diagnosis not present

## 2018-11-06 DIAGNOSIS — F259 Schizoaffective disorder, unspecified: Secondary | ICD-10-CM | POA: Insufficient documentation

## 2018-11-06 DIAGNOSIS — Z8709 Personal history of other diseases of the respiratory system: Secondary | ICD-10-CM | POA: Insufficient documentation

## 2018-11-06 DIAGNOSIS — Z91010 Allergy to peanuts: Secondary | ICD-10-CM | POA: Diagnosis not present

## 2018-11-06 DIAGNOSIS — F4310 Post-traumatic stress disorder, unspecified: Secondary | ICD-10-CM | POA: Insufficient documentation

## 2018-11-06 DIAGNOSIS — Z79899 Other long term (current) drug therapy: Secondary | ICD-10-CM | POA: Diagnosis not present

## 2018-11-06 LAB — BASIC METABOLIC PANEL
Anion gap: 10 (ref 5–15)
BUN: 12 mg/dL (ref 6–20)
CO2: 22 mmol/L (ref 22–32)
Calcium: 8.8 mg/dL — ABNORMAL LOW (ref 8.9–10.3)
Chloride: 103 mmol/L (ref 98–111)
Creatinine, Ser: 0.77 mg/dL (ref 0.44–1.00)
GFR calc Af Amer: >60 mL/min (ref >60)
GFR calc non Af Amer: >60 mL/min (ref >60)
Glucose, Bld: 102 mg/dL — ABNORMAL HIGH (ref 70–99)
Potassium: 3.9 mmol/L (ref 3.5–5.1)
Sodium: 135 mmol/L (ref 135–145)

## 2018-11-06 LAB — CBC
HCT: 33 % — ABNORMAL LOW (ref 36.0–46.0)
Hemoglobin: 9.4 g/dL — ABNORMAL LOW (ref 12.0–15.0)
MCH: 20.8 pg — ABNORMAL LOW (ref 26.0–34.0)
MCHC: 28.5 g/dL — ABNORMAL LOW (ref 30.0–36.0)
MCV: 73 fL — ABNORMAL LOW (ref 80.0–100.0)
Platelets: 385 10*3/uL (ref 150–400)
RBC: 4.52 MIL/uL (ref 3.87–5.11)
RDW: 19.7 % — ABNORMAL HIGH (ref 11.5–15.5)
WBC: 10.8 10*3/uL — ABNORMAL HIGH (ref 4.0–10.5)
nRBC: 0 % (ref 0.0–0.2)

## 2018-11-06 LAB — I-STAT BETA HCG BLOOD, ED (MC, WL, AP ONLY): I-stat hCG, quantitative: <5 m[IU]/mL (ref <5)

## 2018-11-06 LAB — TROPONIN I (HIGH SENSITIVITY)
Troponin I (High Sensitivity): <2 ng/L (ref <18)
Troponin I (High Sensitivity): <2 ng/L (ref <18)

## 2018-11-06 MED ORDER — SODIUM CHLORIDE 0.9% FLUSH: 3.0000 mL | Freq: Once | INTRAVENOUS | Status: DC

## 2018-11-06 NOTE — ED Notes (Signed)
Discharge instructions discussed with pt. Pt verbalized understanding. Pt stable and ambulatory. No signature pad available. 

## 2018-11-06 NOTE — ED Triage Notes (Signed)
Pt reports chest pain and worsening SHOB x 3-5 days. Pt denies sick contact, fever, or cough. Pt reports increased vaginal bleeding. Pt reports seasonal asthma.

## 2018-11-06 NOTE — ED Provider Notes (Signed)
[facility] EMERGENCY DEPARTMENT Provider Note   CSN: [id number] Arrival date & time: 11/06/18  1639     History   Chief Complaint Chief Complaint  Patient presents with  . Chest Pain    HPI Tricia Scott is a 23 y.o. female.     HPI Patient is a 23 year old female with past medical history of asthma, obesity, schizoaffective disorder, PTSD and menorrhagia who presents to the emergency department today for evaluation of midsternal chest pain for the past 4 days.  She denies any palpitations or shortness of breath, she denies abdominal pain, nausea, vomiting, diarrhea.  The pain is not made worse with position changes, and is not worse with deep breathing.  It is worse with pressing on her mid chest.  She denies cough.  She denies any known contacts to anyone that has tested positive for COVID-19.  She denies fever or chills.  She is currently being treated by her GYN for the menorrhagia, and she was recently started on oral iron supplementation for iron deficiency anemia.  She was concerned that her anemia may have gotten worse and that she might need a blood transfusion.   Past Medical History:  Diagnosis Date  . Allergy   . Asthma   . Obesity   . PTSD (post-traumatic stress disorder)   . Schizo-affective psychosis ([place])   . Sprains and strains of ankle and foot 12/15/2011   Right side; occurred on Wednesday 12/12/2011  . Urinary tract infection     Patient Active Problem List   Diagnosis Date Noted  . Moderate recurrent major depression ([place]) 04/22/2011  . 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 Generalized anxiety disorder 04/22/2011  . Oppositional defiant disorder 04/22/2011    Past Surgical History:  Procedure Laterality Date  . NO PAST SURGERIES    . wisdom tooth removal       OB History   No obstetric history on file.      Home Medications    Prior to Admission medications   Medication Sig Start Date End Date Taking? Authorizing  Provider  acetaminophen (TYLENOL) 500 MG tablet Take 1 tablet (500 mg total) by mouth every 6 (six) hours as needed. 01/12/18   Law, Bea Graff, PA-C  albuterol (ACCUNEB) 0.63 MG/3ML nebulizer solution Take 1 ampule by nebulization every 6 (six) hours as needed for wheezing or shortness of breath.     [provider]  cetirizine (ZYRTEC) 10 MG tablet Take 1 tablet (10 mg total) by mouth at bedtime. 09/24/18 12/23/18  Gildardo Pounds, NP  cromolyn (NASALCROM) 5.2 MG/ACT nasal spray Place 1 spray into both nostrils 4 (four) times daily. 09/24/18   Gildardo Pounds, NP  ibuprofen (ADVIL) 600 MG tablet Take 1 tablet (600 mg total) by mouth every 6 (six) hours as needed for headache or cramping. 09/24/18   Gildardo Pounds, NP  megestrol (MEGACE) 40 MG tablet Take 1 tablet (40 mg total) by mouth 2 (two) times daily. 11/03/18   Emily Filbert, MD  Vilazodone HCl (VIIBRYD) 40 MG TABS Take 1 tablet (40 mg total) by mouth at bedtime. For anxiety and depression 04/27/11   Delight Hoh, MD    Family History Family History  Problem Relation Age of Onset  . Diabetes Mother   . Diabetes type II Mother   . Fibromyalgia Mother   . Hypertension Mother   . Mental illness Father   . Mental illness Sister     Social History Social  History   Tobacco Use  . Smoking status: Never Smoker  . Smokeless tobacco: Never Used  Substance Use Topics  . Alcohol use: No  . Drug use: No     Allergies   Aripiprazole, Grass extracts [gramineae pollens], Peanut (diagnostic), Risperidone, Soy allergy, and Seroquel [quetiapine fumerate]   Review of Systems Review of Systems  Constitutional: Negative for chills and fever.  HENT: Negative for ear pain and sore throat.   Eyes: Negative for pain and visual disturbance.  Respiratory: Negative for cough and shortness of breath.   Cardiovascular: Positive for chest pain. Negative for palpitations.  Gastrointestinal: Negative for abdominal pain and vomiting.   Genitourinary: Positive for vaginal bleeding (chronic, menorrhagia ). Negative for dysuria and hematuria.  Musculoskeletal: Positive for back pain (occasional mid-upper back pain between shoulder blades). Negative for arthralgias.  Skin: Negative for color change and rash.  Neurological: Negative for seizures and syncope.  All other systems reviewed and are negative.    Physical Exam Updated Vital Signs BP 121/82 (BP Location: Right Arm)   Pulse 81   Temp 98.6 F (37 C) (Oral)   Resp 16   LMP 10/28/2018 (Exact Date)   SpO2 100%   Physical Exam   ED Treatments / Results  Labs (all labs ordered are listed, but only abnormal results are displayed) Labs Reviewed  BASIC METABOLIC PANEL - Abnormal; Notable for the following components:      Result Value   Glucose, Bld 102 (*)    Calcium 8.8 (*)    All other components within normal limits  CBC - Abnormal; Notable for the following components:   WBC 10.8 (*)    Hemoglobin 9.4 (*)    HCT 33.0 (*)    MCV 73.0 (*)    MCH 20.8 (*)    MCHC 28.5 (*)    RDW 19.7 (*)    All other components within normal limits  I-STAT BETA HCG BLOOD, ED ([facility], [facility], AP ONLY)  TROPONIN I (HIGH SENSITIVITY)  TROPONIN I (HIGH SENSITIVITY)    EKG EKG Interpretation  Date/Time:  Thursday November 06 2018 16:49:20 EDT Ventricular Rate:  97 PR Interval:  134 QRS Duration: 80 QT Interval:  324 QTC Calculation: 411 R Axis:   51 Text Interpretation:  Sinus rhythm with marked sinus arrhythmia Nonspecific T wave abnormality Abnormal ECG When compared to prior, no significant changes seen.  No STEMI Confirmed by Theda Belfastegeler, Chris ([id number]) on 11/06/2018 10:48:01 PM   Radiology Dg Chest Portable 1 View  Result Date: 11/06/2018 CLINICAL DATA:  Chest pain and shortness of breath. EXAM: PORTABLE CHEST 1 VIEW COMPARISON:  None. FINDINGS: The cardiomediastinal contours are normal. The lungs are clear. Pulmonary vasculature is normal. No consolidation, pleural effusion,  or pneumothorax. No acute osseous abnormalities are seen. IMPRESSION: Negative AP view of the chest. Electronically Signed   By: Narda RutherfordMelanie  Sanford M.D.   On: 11/06/2018 23:18    Procedures Procedures (including critical care time)  Medications Ordered in ED Medications  sodium chloride flush (NS) 0.9 % injection 3 mL (has no administration in time range)     Initial Impression / Assessment and Plan / ED Course  I have reviewed the triage vital signs and the nursing notes.  Pertinent labs & imaging results that were available during my care of the patient were reviewed by me and considered in my medical decision making (see chart for details).  Of note, this patient was evaluated in the Emergency Department for the symptoms described  in the history of present illness. She was evaluated in the context of the global COVID-19 pandemic, which necessitated consideration that the patient might be at risk for infection with the SARS-CoV-2 virus that causes COVID-19. Institutional protocols and algorithms that pertain to the evaluation of patients at risk for COVID-19 are in a state of rapid change based on information released by regulatory bodies including the CDC and federal and state organizations. These policies and algorithms were followed during the patient's care in the ED.  During this patient encounter, the patient was wearing a mask, and throughout this encounter I was wearing at least a surgical mask.  I was not within 6 feet of this patient for more than 15 minutes without eye protection when they were not wearing mask.   Differentials considered: Acute coronary syndrome, pneumonia, spontaneous pneumothorax, aortic dissection, musculoskeletal pain  EM Physician interpretation of Labs & Imaging: . CBC reveals a mild leukocytosis of 10.8, not considered clinically significant at this time. . CBC also reveals chronic microcytic anemia, consistent with patient's prior labs and actually  improved from prior hemoglobin of 8.7, one week ago . Metabolic panel unremarkable . She is not pregnant . Serum troponin negative x2 . I have independently reviewed the patient's single view portable chest x-ray, as well as the radiologist interpretation.  There is no obvious evidence of acute pneumothorax, large pleural effusion or large airspace opacity.  Please see radiologists interpretation for further  Medical Decision Making:  Havilah A Laural BenesJohnson is a 23 y.o. female with the above past medical history who presents emergency department today for evaluation of a 4-day history of midsternal chest discomfort.  Her pain was not positional or pleuritic, it is reproducible with palpation in the midsternal region of her chest.  She has no known cardiac or pulmonary history, she is young and grossly otherwise healthy.  She does have a history of menorrhagia and is currently being treated by her primary GYN team, including recently starting iron supplementation.  She was concerned that maybe her anemia had reached a point which she needed blood transfusion or was the cause of her having chest discomfort.  On review of her lab work from today in comparison to prior labs, her hemoglobin at 9.4 is actually improved from 1 week ago when it was 8.7.   At the time of my evaluation of the patient in our ED, she had already been here several hours and had labs resulted. She reported only mild residual pain. She expressed that she was hungry and she wished to go home, however, she was agreeable to staying to obtain a chest x-ray.  Chest x-ray was obtained and reviewed as described above.  No evidence of pneumonia, and not consistent with HPI, no pneumothorax, dissection quite unlikely, and a grossly normal EKG for her age along with 2- troponins.  The most likely etiology of her presentation is musculoskeletal in origin.  At this time I believe no further emergent labs or imaging are indicated and the patient to be  safe for discharge to home. They are in agreement with this plan, and are comfortable with discharge to home at this time. I discussed with them concerning signs and symptoms that would necessitate return to the emergency department.  They voiced understanding of these instructions and had no further questions.  The plan for this patient was discussed with my attending physician, Dr. Rochele RaringKristen Ward, who voiced agreement and who oversaw evaluation and treatment of this patient.   CLINICAL  IMPRESSION: 1. Chest wall pain   2. Iron deficiency anemia due to chronic blood loss      Disposition: Discharge   Jannely Henthorn A. Mayford KnifeWilliams, MD Resident Physician, PGY-3 Emergency Medicine [facility]    Saverio DankerWilliams, Laressa Bolinger A, MD 11/07/18 0014    Ward, Layla MawKristen N, DO 11/07/18 [phone]

## 2018-11-06 NOTE — Telephone Encounter (Signed)
Patient called in she have some questions about her getting a blood transfusion

## 2018-11-06 NOTE — Telephone Encounter (Signed)
Called patient regarding mychart message. She reports shortness of breath, heaviness in her chest and chest pain over the past week. She also reports headaches and some nasal congestion and runny nose that she attributes to allergies but her allergy medication isn't working as well as it normally does. Discussed with telephone call and mychart message with Dr Hulan Fray who advises patient go to ER- hopefully she will also receive a blood transfusion there as well.  Informed patient and told her to let us know if she does not receive blood. Patient verbalized understanding & confirmed appts for next week.

## 2018-11-06 NOTE — Discharge Instructions (Signed)
You may take over-the-counter Tylenol and Non-steroidal anti-inflammatories for chest wall discomfort & pain.  Please follow-up with your OB/GYN next week and continue taking your OTC iron supplementation.

## 2018-11-11 ENCOUNTER — Other Ambulatory Visit: Payer: Self-pay

## 2018-11-11 ENCOUNTER — Ambulatory Visit (HOSPITAL_COMMUNITY)
Admission: RE | Admit: 2018-11-11 | Discharge: 2018-11-11 | Disposition: A | Discharge status: Home / Self Care | Payer: Medicare Other | Source: Ambulatory Visit | Attending: Obstetrics & Gynecology | Admitting: Obstetrics & Gynecology

## 2018-11-11 ENCOUNTER — Other Ambulatory Visit: Payer: Self-pay | Admitting: Obstetrics & Gynecology

## 2018-11-11 DIAGNOSIS — N920 Excessive and frequent menstruation with regular cycle: Secondary | ICD-10-CM | POA: Insufficient documentation

## 2018-11-12 ENCOUNTER — Other Ambulatory Visit (HOSPITAL_COMMUNITY)
Admission: RE | Admit: 2018-11-12 | Discharge: 2018-11-12 | Disposition: A | Discharge status: Home / Self Care | Payer: Medicare Other | Source: Ambulatory Visit | Attending: Obstetrics and Gynecology | Admitting: Obstetrics and Gynecology

## 2018-11-12 ENCOUNTER — Encounter: Payer: Self-pay | Admitting: Obstetrics and Gynecology

## 2018-11-12 ENCOUNTER — Ambulatory Visit (INDEPENDENT_AMBULATORY_CARE_PROVIDER_SITE_OTHER): Payer: Medicare Other | Admitting: Obstetrics and Gynecology

## 2018-11-12 VITALS — BP 111/84 | HR 97 | Ht 62.0 in | Wt 230.0 lb

## 2018-11-12 DIAGNOSIS — N920 Excessive and frequent menstruation with regular cycle: Secondary | ICD-10-CM | POA: Insufficient documentation

## 2018-11-12 DIAGNOSIS — Z6841 Body Mass Index (BMI) 40.0 and over, adult: Secondary | ICD-10-CM | POA: Insufficient documentation

## 2018-11-12 DIAGNOSIS — D649 Anemia, unspecified: Secondary | ICD-10-CM | POA: Insufficient documentation

## 2018-11-12 DIAGNOSIS — D500 Iron deficiency anemia secondary to blood loss (chronic): Secondary | ICD-10-CM

## 2018-11-12 DIAGNOSIS — Z862 Personal history of diseases of the blood and blood-forming organs and certain disorders involving the immune mechanism: Secondary | ICD-10-CM | POA: Diagnosis not present

## 2018-11-12 MED ORDER — POLYSACCHARIDE IRON COMPLEX 150 MG PO CAPS: 150.0000 mg | ORAL_CAPSULE | Freq: Every day | ORAL | 0 refills | Status: DC

## 2018-11-12 MED ORDER — DOCUSATE SODIUM 100 MG PO CAPS: 100.0000 mg | ORAL_CAPSULE | Freq: Two times a day (BID) | ORAL | 1 refills | Status: DC | PRN

## 2018-11-12 NOTE — Patient Instructions (Signed)

## 2018-11-12 NOTE — Progress Notes (Signed)
Obstetrics and Gynecology Established Patient Evaluation  Appointment Date: 11/12/2018  [facility] Clinic[facility]  Primary Care Provider: Claiborne RiggFleming, Zelda W  Referring Provider: Claiborne RiggFleming, Zelda W, NP  Chief Complaint:  Chief Complaint  Patient presents with  . Follow-up    History of Present Illness: Tricia Scott is a 23 y.o. African-American G0  (Patient's last menstrual period was 10/28/2018 (exact date).), seen for the above chief complaint. Her past medical history is significant for BMI 40s, GAD.  Patient seen for NPE by Dr. Marice Potterove earlier this month for menorrhagia and had CBC, TSH and u/s done. CBC shows anemia, normal TSH and u/s was normal. In the interim, she was put on bid megace which has stopped her bleeding and she is still taking it.   She states that her periods are always qmonth, regular and no intermenstrual bleeding but her period didn't come on in June and she had a period on 7/11 and that persisted which is why she made the GYN appt.    Review of Systems:  as noted in the History of Present Illness.  Patient Active Problem List   Diagnosis Date Noted  . Anemia 11/12/2018  . Menorrhagia 11/12/2018  . BMI 40.0-44.9, adult (HCC) 11/12/2018  . 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 Generalized anxiety disorder 04/22/2011  . Oppositional defiant disorder 04/22/2011    Past Medical History:  Past Medical History:  Diagnosis Date  . Allergy   . Asthma   . Obesity   . PTSD (post-traumatic stress disorder)   . Schizo-affective psychosis (HCC)   . Sprains and strains of ankle and foot 12/15/2011   Right side; occurred on Wednesday 12/12/2011  . Urinary tract infection     Past Surgical History:  Past Surgical History:  Procedure Laterality Date  . NO PAST SURGERIES    . wisdom tooth removal      Past Obstetrical History:  OB History  Gravida Para Term Preterm AB Living  0 0  0 0 0 0  SAB TAB Ectopic Multiple Live Births  0 0 0 0 0    Past Gynecological History: As per HPI. Menarche age 89 Periods: qmonth, regular and has always been heavy and painful since menarche. 5-7 days History of Pap Smear(s): No. Patient has never been sexually active She is currently using abstinence for contraception.   Social History:  Social History   Socioeconomic History  . Marital status: Single    Spouse name: Not on file  . Number of children: Not on file  . Years of education: Not on file  . Highest education level: Not on file  Occupational History  . Not on file  Social Needs  . Financial resource strain: Not on file  . Food insecurity    Worry: Not on file    Inability: Not on file  . Transportation needs    Medical: Not on file    Non-medical: Not on file  Tobacco Use  . Smoking status: Never Smoker  . Smokeless tobacco: Never Used  Substance and Sexual Activity  . Alcohol use: No  . Drug use: No  . Sexual activity: Never    Birth control/protection: None  Lifestyle  . Physical activity    Days per week: Not on file    Minutes per session: Not on file  . Stress: Not on file  Relationships  . Social connections    Talks on phone: Not on file  Gets together: Not on file    Attends religious service: Not on file    Active member of club or organization: Not on file    Attends meetings of clubs or organizations: Not on file    Relationship status: Not on file  . Intimate partner violence    Fear of current or ex partner: Not on file    Emotionally abused: Not on file    Physically abused: Not on file    Forced sexual activity: Not on file  Other Topics Concern  . Not on file  Social History Narrative  . Not on file    Family History:  Family History  Problem Relation Age of Onset  . Diabetes Mother   . Diabetes type II Mother   . Fibromyalgia Mother   . Hypertension Mother   . Mental illness Father   . Mental illness Sister    She  denies any female cancers, bleeding or blood clotting disorders.    Medications Tricia Scott had no medications administered during this visit. Current Outpatient Medications  Medication Sig Dispense Refill  . acetaminophen (TYLENOL) 500 MG tablet Take 1 tablet (500 mg total) by mouth every 6 (six) hours as needed. 30 tablet 0  . cetirizine (ZYRTEC) 10 MG tablet Take 1 tablet (10 mg total) by mouth at bedtime. 90 tablet 2  . ibuprofen (ADVIL) 600 MG tablet Take 1 tablet (600 mg total) by mouth every 6 (six) hours as needed for headache or cramping. 60 tablet 0  . megestrol (MEGACE) 40 MG tablet Take 1 tablet (40 mg total) by mouth 2 (two) times daily. 60 tablet 5  . albuterol (ACCUNEB) 0.63 MG/3ML nebulizer solution Take 1 ampule by nebulization every 6 (six) hours as needed for wheezing or shortness of breath.     . cromolyn (NASALCROM) 5.2 MG/ACT nasal spray Place 1 spray into both nostrils 4 (four) times daily. (Patient not taking: Reported on 11/12/2018) 26 mL 12   No current facility-administered medications for this visit.     Allergies Aripiprazole, Grass extracts [gramineae pollens], Peanut (diagnostic), Risperidone, Soy allergy, and Seroquel [quetiapine fumerate]   Physical Exam:  BP 111/84   Pulse 97   Ht 5\' 2"  (1.575 m)   Wt 230 lb (104.3 kg)   LMP 10/28/2018 (Exact Date)   BMI 42.07 kg/m  Body mass index is 42.07 kg/m.  General appearance: Well nourished, well developed female in no acute distress.  Neck:  Supple, normal appearance, and no thyromegaly  Cardiovascular: normal s1 and s2.  No murmurs, rubs or gallops. Respiratory:  Clear to auscultation bilateral. Normal respiratory effort Abdomen: positive bowel sounds and no masses, hernias; diffusely non tender to palpation, non distended Neuro/Psych:  Normal mood and affect.  Skin:  Warm and dry.  Lymphatic:  No inguinal lymphadenopathy.   Pelvic exam: is limited by body habitus EGBUS: within normal  limits Vagina: no blood in vault, no discharge. Patient very intolerant of exam Cervix: glimpses appear normal, pap attempted Bimanual: deferred  Laboratory: As per HPI 7/23: neg beta hcg CBC Latest Ref Rng & Units 11/06/2018 10/31/2018 04/29/2016  WBC 4.0 - 10.5 K/uL 10.8(H) 9.4 8.6  Hemoglobin 12.0 - 15.0 g/dL 9.4(L) 8.7(L) 10.7(L)  Hematocrit 36.0 - 46.0 % 33.0(L) 31.3(L) 33.6(L)  Platelets 150 - 400 K/uL 385 358 357   BMP Latest Ref Rng & Units 11/06/2018 04/29/2016 04/22/2011  Glucose 70 - 99 mg/dL 102(H) 101(H) 88  BUN 6 - 20 mg/dL 12 8  11  Creatinine 0.44 - 1.00 mg/dL 1.610.77 0.960.84 0.450.90  Sodium 135 - 145 mmol/L 135 138 138  Potassium 3.5 - 5.1 mmol/L 3.9 3.7 4.1  Chloride 98 - 111 mmol/L 103 105 103  CO2 22 - 32 mmol/L 22 26 28   Calcium 8.9 - 10.3 mg/dL 4.0(J8.8(L) 8.1(X8.6(L) 9.5   Radiology:  CLINICAL DATA:  Menorrhagia with regular cycle  EXAM: TRANSABDOMINAL AND TRANSVAGINAL ULTRASOUND OF PELVIS  TECHNIQUE: Both transabdominal and transvaginal ultrasound examinations of the pelvis were performed. Transabdominal technique was performed for global imaging of the pelvis including uterus, ovaries, adnexal regions, and pelvic cul-de-sac. It was necessary to proceed with endovaginal exam following the transabdominal exam to visualize the adnexa. Patient was unable to tolerate transvaginal imaging and transvaginal imaging was limited to assessment of the endometrium.  COMPARISON:  None  FINDINGS: Uterus  Measurements: 4.8 x 3.6 x 4.4 cm = volume: 40 mL. Anteverted and anteflexed. Normal morphology without mass  Endometrium  Thickness: 14 mm. Mildly heterogeneous echogenicity at the upper uterine segment, question minimal fluid. No definite focal mass lesion.  Right ovary  Measurements: 2.7 x 1.9 x 1.9 cm = volume: 5.0 mL. Normal morphology without mass  Left ovary  Measurements: 2.4 x 1.9 x 1.6 cm = volume: 3.6 mL. Normal morphology without mass  Other  findings  No free pelvic fluid or adnexal masses.  IMPRESSION: Limited transvaginal imaging, patient unable to tolerate period  Question minimal endometrial fluid at the upper uterine segment.  Otherwise negative exam.   Electronically Signed   By: Ulyses SouthwardMark  Boles M.D.   On: 11/11/2018 16:29  Assessment: pt stable  Plan:  1. BMI 40.0-44.9, adult (HCC)  2. Menorrhagia with regular cycle D/w her that since has been going on since menarche, recommend further blood work up. If all negative, recommend some type of hormone management risk. Estrogen risks d/w her. I told her I prefer estrogen containing options first, then LNG IUD, depo provera. Pt leaning towards combined OCP and would like a cycle qmonth with it - Cytology - PAP - Protime-INR - Fibrinogen - Von Willebrand panel - APTT  3. Iron deficiency anemia due to chronic blood loss Bid iron. I told her that she can cancel the IV feraheme - APTT  RTC 4335m after starting OCPs for bp check and 6920m after ocps to f/u s/s.   Cornelia Copaharlie Miyonna Ormiston, Jr MD Attending [facility](Faculty Practice)

## 2018-11-13 ENCOUNTER — Encounter: Payer: Self-pay | Admitting: Obstetrics and Gynecology

## 2018-11-14 LAB — CYTOLOGY - PAP
Adequacy: ABSENT
Chlamydia: NEGATIVE
Diagnosis: NEGATIVE
Neisseria Gonorrhea: NEGATIVE
Trichomonas: NEGATIVE

## 2018-11-14 LAB — VON WILLEBRAND PANEL
Factor VIII Activity: 188 % — ABNORMAL HIGH (ref 56–140)
Von Willebrand Ag: 129 % (ref 50–200)
Von Willebrand Factor: 66 % (ref 50–200)

## 2018-11-14 LAB — COAG STUDIES INTERP REPORT

## 2018-11-14 LAB — PROTIME-INR
INR: 1 (ref 0.8–1.2)
Prothrombin Time: 10.8 s (ref 9.1–12.0)

## 2018-11-14 LAB — APTT: aPTT: 26 s (ref 24–33)

## 2018-11-14 LAB — FIBRINOGEN: Fibrinogen: 568 mg/dL — ABNORMAL HIGH (ref 193–507)

## 2018-11-19 ENCOUNTER — Encounter: Payer: Self-pay | Admitting: Obstetrics and Gynecology

## 2018-11-19 ENCOUNTER — Other Ambulatory Visit: Payer: Self-pay | Admitting: Obstetrics and Gynecology

## 2018-11-19 DIAGNOSIS — R791 Abnormal coagulation profile: Secondary | ICD-10-CM

## 2018-11-19 NOTE — Progress Notes (Signed)
GYN Telephone Note Results d/w pt and regarding elevated factor 8 level. I told her that it sometimes can be transient and recommend recheck; pt hasn't started the birth control pills yet. Pt told to hold off on starting them until we recheck her labs again. If elevated, recommend heme referral. If normal then recommend repeat and if still normal then can do birth control pill course.  Pt to call clinic to make lab only appt.   Durene Romans MD Attending [facility] (Faculty Practice) 11/19/2018 Time: [phone]

## 2018-11-24 ENCOUNTER — Encounter: Payer: Self-pay | Admitting: Nurse Practitioner

## 2018-11-24 ENCOUNTER — Other Ambulatory Visit: Payer: Self-pay

## 2018-11-24 ENCOUNTER — Ambulatory Visit: Payer: Medicare Other | Attending: Nurse Practitioner | Admitting: Nurse Practitioner

## 2018-11-24 DIAGNOSIS — J45909 Unspecified asthma, uncomplicated: Secondary | ICD-10-CM | POA: Insufficient documentation

## 2018-11-24 DIAGNOSIS — D509 Iron deficiency anemia, unspecified: Secondary | ICD-10-CM | POA: Diagnosis not present

## 2018-11-24 DIAGNOSIS — E669 Obesity, unspecified: Secondary | ICD-10-CM | POA: Diagnosis not present

## 2018-11-24 DIAGNOSIS — N939 Abnormal uterine and vaginal bleeding, unspecified: Secondary | ICD-10-CM | POA: Diagnosis not present

## 2018-11-24 DIAGNOSIS — F259 Schizoaffective disorder, unspecified: Secondary | ICD-10-CM

## 2018-11-24 NOTE — Progress Notes (Signed)
Virtual Visit via Telephone Note Due to national recommendations of social distancing due to [place] 19, telehealth visit is felt to be most appropriate for this patient at this time.  I discussed the limitations, risks, security and privacy concerns of performing an evaluation and management service by telephone and the availability of in person appointments. I also discussed with the patient that there may be a patient responsible charge related to this service. The patient expressed understanding and agreed to proceed.    I connected with Tricia Scott on 11/24/18  at   1:30 PM EDT  EDT by telephone and verified that I am speaking with the correct person using two identifiers.   Consent I discussed the limitations, risks, security and privacy concerns of performing an evaluation and management service by telephone and the availability of in person appointments. I also discussed with the patient that there may be a patient responsible charge related to this service. The patient expressed understanding and agreed to proceed.   Location of Patient: Private Residence   Location of Provider: [place] and [place] participating in Telemedicine visit: [name] FNP-BC [place]    History of Present Illness: Telemedicine visit for: Follow UP to AUB.  has a past medical history of Abnormal uterine bleeding (AUB), Allergy, Asthma, Obesity, PTSD (post-traumatic stress disorder), Schizo-affective psychosis ([place]), Sprains and strains of ankle and foot (12/15/2011), and Urinary tract infection.  She is following up today for AUB. She has been referred and is now  currently seeing Gynecology for work up. Plan is for blood work u Sales executive), PAP and possible hormone management if blood work negative. Currently taking feraheme for IDA.  She has PTSD and schizo-affective psychosis and being treated by the [facility] of the [place]. Treatments  are now once a month instead of every other week due to her previous therapist relocating. She denies any mood lability or thoughts of self harm.    Past Medical History:  Diagnosis Date  . Abnormal uterine bleeding (AUB)   . Allergy   . Asthma   . Obesity   . PTSD (post-traumatic stress disorder)   . Schizo-affective psychosis ([place])   . Sprains and strains of ankle and foot 12/15/2011   Right side; occurred on Wednesday 12/12/2011  . Urinary tract infection     Past Surgical History:  Procedure Laterality Date  . NO PAST SURGERIES    . wisdom tooth removal      Family History  Problem Relation Age of Onset  . Diabetes Mother   . Diabetes type II Mother   . Fibromyalgia Mother   . Hypertension Mother   . Mental illness Father   . Mental illness Sister     Social History   Socioeconomic History  . Marital status: Single    Spouse name: Not on file  . Number of children: Not on file  . Years of education: Not on file  . Highest education level: Not on file  Occupational History  . Not on file  Social Needs  . Financial resource strain: Not on file  . Food insecurity    Worry: Not on file    Inability: Not on file  . Transportation needs    Medical: Not on file    Non-medical: Not on file  Tobacco Use  . Smoking status: Never Smoker  . Smokeless tobacco: Never Used  Substance and Sexual Activity  . Alcohol use:  No  . Drug use: No  . Sexual activity: Never    Birth control/protection: None  Lifestyle  . Physical activity    Days per week: Not on file    Minutes per session: Not on file  . Stress: Not on file  Relationships  . Social Musicianconnections    Talks on phone: Not on file    Gets together: Not on file    Attends religious service: Not on file    Active member of club or organization: Not on file    Attends meetings of clubs or organizations: Not on file    Relationship status: Not on file  Other Topics Concern  . Not on file  Social History Narrative   . Not on file     Observations/Objective: Awake, alert and oriented x 3   Review of Systems  Constitutional: Negative for fever, malaise/fatigue and weight loss.  HENT: Negative.  Negative for nosebleeds.   Eyes: Negative.  Negative for blurred vision, double vision and photophobia.  Respiratory: Negative.  Negative for cough and shortness of breath.   Cardiovascular: Negative.  Negative for chest pain, palpitations and leg swelling.  Gastrointestinal: Negative.  Negative for heartburn, nausea and vomiting.  Genitourinary:       AUB  Musculoskeletal: Negative.  Negative for myalgias.  Neurological: Negative.  Negative for dizziness, focal weakness, seizures and headaches.  Endo/Heme/Allergies: Positive for environmental allergies.  Psychiatric/Behavioral: Positive for depression. Negative for suicidal ideas.    Assessment and Plan: Tricia Scott was seen today for follow-up.  Diagnoses and all orders for this visit:  Abnormal uterine bleeding (AUB) Being followed by GYN Schizo affective D/O [facility]  Does not appear that she is taking any antipsychotics  Follow Up Instructions Return if symptoms worsen or fail to improve.     I discussed the assessment and treatment plan with the patient. The patient was provided an opportunity to ask questions and all were answered. The patient agreed with the plan and demonstrated an understanding of the instructions.   The patient was advised to call back or seek an in-person evaluation if the symptoms worsen or if the condition fails to improve as anticipated.  I provided 18 minutes of non-face-to-face time during this encounter including median intraservice time, reviewing previous notes, labs, imaging, medications and explaining diagnosis and management.  Claiborne RiggZelda W Vinayak Bobier, FNP-BC

## 2018-11-30 ENCOUNTER — Encounter: Payer: Self-pay | Admitting: Nurse Practitioner

## 2018-11-30 DIAGNOSIS — F259 Schizoaffective disorder, unspecified: Secondary | ICD-10-CM | POA: Insufficient documentation

## 2018-12-01 ENCOUNTER — Other Ambulatory Visit: Payer: Medicare Other

## 2018-12-03 ENCOUNTER — Other Ambulatory Visit: Payer: Self-pay

## 2018-12-03 ENCOUNTER — Other Ambulatory Visit: Payer: Medicare Other

## 2018-12-03 DIAGNOSIS — R791 Abnormal coagulation profile: Secondary | ICD-10-CM

## 2018-12-05 LAB — FACTOR 8 ASSAY: Factor VIII Activity: 187 % — ABNORMAL HIGH (ref 56–140)

## 2018-12-11 ENCOUNTER — Telehealth (INDEPENDENT_AMBULATORY_CARE_PROVIDER_SITE_OTHER): Payer: Medicare Other | Admitting: Lactation Services

## 2018-12-11 ENCOUNTER — Telehealth: Payer: Self-pay | Admitting: Obstetrics and Gynecology

## 2018-12-11 ENCOUNTER — Telehealth: Payer: Self-pay | Admitting: Hematology

## 2018-12-11 DIAGNOSIS — N920 Excessive and frequent menstruation with regular cycle: Secondary | ICD-10-CM

## 2018-12-11 DIAGNOSIS — R791 Abnormal coagulation profile: Secondary | ICD-10-CM

## 2018-12-11 DIAGNOSIS — D500 Iron deficiency anemia secondary to blood loss (chronic): Secondary | ICD-10-CM

## 2018-12-11 NOTE — Telephone Encounter (Signed)
Received a new hem referral and call from [facility] [place] to schedule an appt w/Dr. Irene Limbo on 9/22 at 1pm. Referring office will notify the pt. Made aware to arrive 15 minutes early.

## 2018-12-11 NOTE — Telephone Encounter (Signed)
GYN Note D/w her didn't know her lab test was done b/c I didn't get the result. Given persistent elevation, I told her I recommend we have her see Heme to see about the clinical significance of it. In the interim, I told her to restart the megace back at 40 2x/day (she had stopped it after her bleeding stopped last time) and stay on it until I see her after her heme appt. Pt amenable to plan  Durene Romans MD Attending [facility] [place] (Faculty Practice) 12/11/2018 Time: [phone]

## 2018-12-11 NOTE — Telephone Encounter (Signed)
Pt called in to the office with concerns that she is still bleeding after 12 days. She feels the bleeding has changed.   She did not start the OCP's per Dr. Ilda Basset. She is not currently taking the Megace. She is taking the Fe.   Pt reports she is feeling anxious and would like to know what she needs to do. She has repeat labs on 8/19.

## 2018-12-11 NOTE — Telephone Encounter (Signed)
I spoke to her and put in a hematology referral. Can y'all work on that? Thanks!

## 2018-12-11 NOTE — Telephone Encounter (Signed)
Called and scheduled for first availble appointment at [facility]/Hematology for 01/06/19 at 1:00.Called Carlicia and gave her the referral appointment and instructions. She voices understanding.  Artavius Stearns,RN

## 2018-12-15 MED ORDER — NORETHINDRONE ACETATE 5 MG PO TABS: ORAL_TABLET | ORAL | 1 refills | Status: DC

## 2018-12-23 ENCOUNTER — Other Ambulatory Visit: Payer: Medicare Other

## 2018-12-26 ENCOUNTER — Other Ambulatory Visit: Payer: Self-pay

## 2018-12-26 ENCOUNTER — Encounter: Payer: Self-pay | Admitting: Nurse Practitioner

## 2018-12-26 ENCOUNTER — Ambulatory Visit: Payer: Medicare Other | Attending: Nurse Practitioner | Admitting: Nurse Practitioner

## 2018-12-26 VITALS — BP 117/76 | HR 82 | Temp 98.9°F | Ht 61.0 in | Wt 228.0 lb

## 2018-12-26 DIAGNOSIS — Z6841 Body Mass Index (BMI) 40.0 and over, adult: Secondary | ICD-10-CM | POA: Diagnosis not present

## 2018-12-26 DIAGNOSIS — J45909 Unspecified asthma, uncomplicated: Secondary | ICD-10-CM | POA: Insufficient documentation

## 2018-12-26 DIAGNOSIS — Z818 Family history of other mental and behavioral disorders: Secondary | ICD-10-CM | POA: Insufficient documentation

## 2018-12-26 DIAGNOSIS — Z888 Allergy status to other drugs, medicaments and biological substances status: Secondary | ICD-10-CM | POA: Diagnosis not present

## 2018-12-26 DIAGNOSIS — F4310 Post-traumatic stress disorder, unspecified: Secondary | ICD-10-CM | POA: Insufficient documentation

## 2018-12-26 DIAGNOSIS — Z833 Family history of diabetes mellitus: Secondary | ICD-10-CM | POA: Diagnosis not present

## 2018-12-26 DIAGNOSIS — Z0000 Encounter for general adult medical examination without abnormal findings: Secondary | ICD-10-CM | POA: Diagnosis present

## 2018-12-26 DIAGNOSIS — Z793 Long term (current) use of hormonal contraceptives: Secondary | ICD-10-CM | POA: Diagnosis not present

## 2018-12-26 DIAGNOSIS — Z91010 Allergy to peanuts: Secondary | ICD-10-CM | POA: Insufficient documentation

## 2018-12-26 DIAGNOSIS — F259 Schizoaffective disorder, unspecified: Secondary | ICD-10-CM | POA: Diagnosis not present

## 2018-12-26 DIAGNOSIS — Z91048 Other nonmedicinal substance allergy status: Secondary | ICD-10-CM | POA: Diagnosis not present

## 2018-12-26 DIAGNOSIS — Z79899 Other long term (current) drug therapy: Secondary | ICD-10-CM | POA: Diagnosis not present

## 2018-12-26 NOTE — Progress Notes (Signed)
Assessment & Plan:  Tricia Scott was seen today for annual exam.  Diagnoses and all orders for this visit:  Encounter for annual physical exam  Morbid obesity with BMI of 40.0-44.9, adult (HCC) Discussed diet and exercise for person with BMI >40. Instructed: You must burn more calories than you eat. Losing 5 percent of your body weight should be considered a success. In the longer term, losing more than 15 percent of your body weight and staying at this weight is an extremely good result. However, keep in mind that even losing 5 percent of your body weight leads to important health benefits, so try not to get discouraged if you're not able to lose more than this. Will recheck weight in 3-6 months.  Patient has been counseled on age-appropriate routine health concerns for screening and prevention. These are reviewed and up-to-date. Referrals have been placed accordingly. Immunizations are up-to-date or declined.    Subjective:   Chief Complaint  Patient presents with  . Annual Exam    Pt. is here for a physical.   HPI Tricia Scott 23 y.o. female presents to office today for physical. States her menstrual cycle came on recently and she is unsure whether to take th Aygestin with the onset of her cycle or only if her cycle continues beyond the usual completion date. I have referred her back to GYN regarding this question. I have also instructed her to resume her ferrous sulfate or liquid iron which she has not been taking. 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  PAP Deferred as recently completed by GYN  Review of Systems  Constitutional: Positive for malaise/fatigue. Negative for chills, fever and weight loss.       Hair thinning Excessive daytime sleepiness  HENT: Negative.  Negative for congestion, hearing loss, sinus pain and sore throat.   Eyes: Negative.  Negative for blurred vision, double vision, photophobia and pain.   Respiratory: Negative.  Negative for cough, sputum production, shortness of breath and wheezing.   Cardiovascular: Negative.  Negative for chest pain and leg swelling.  Gastrointestinal: Positive for constipation. Negative for abdominal pain, diarrhea, heartburn, nausea and vomiting.  Genitourinary: Negative.        AUB  Musculoskeletal: Negative.  Negative for joint pain and myalgias.  Skin: Negative.  Negative for rash.  Neurological: Negative.  Negative for dizziness, tremors, speech change, focal weakness, seizures and headaches.  Endo/Heme/Allergies: Positive for environmental allergies.  Psychiatric/Behavioral: Negative.  Negative for depression and suicidal ideas. The patient is not nervous/anxious and does not have insomnia.        PTSD Schizoaffective psychosis    Past Medical History:  Diagnosis Date  . Abnormal uterine bleeding (AUB)   . Allergy   . Asthma   . Obesity   . PTSD (post-traumatic stress disorder)   . Schizo-affective psychosis (HCC)   . Sprains and strains of ankle and foot 12/15/2011   Right side; occurred on Wednesday 12/12/2011  . Urinary tract infection     Past Surgical History:  Procedure Laterality Date  . NO PAST SURGERIES    . wisdom tooth removal      Family History  Problem Relation Age of Onset  . Diabetes Mother   . Diabetes type II Mother   . Fibromyalgia Mother   . Hypertension Mother   . Mental illness Father   . Mental illness Sister     Social History Reviewed with no  changes to be made today.   Outpatient Medications Prior to Visit  Medication Sig Dispense Refill  . acetaminophen (TYLENOL) 500 MG tablet Take 1 tablet (500 mg total) by mouth every 6 (six) hours as needed. 30 tablet 0  . albuterol (ACCUNEB) 0.63 MG/3ML nebulizer solution Take 1 ampule by nebulization every 6 (six) hours as needed for wheezing or shortness of breath.     . docusate sodium (COLACE) 100 MG capsule Take 1 capsule (100 mg total) by mouth 2 (two)  times daily as needed. 60 capsule 1  . ibuprofen (ADVIL) 600 MG tablet Take 1 tablet (600 mg total) by mouth every 6 (six) hours as needed for headache or cramping. 60 tablet 0  . norethindrone (AYGESTIN) 5 MG tablet Take 1 tablet BID. May take two tablets BID if bleeding returns 60 tablet 1  . cetirizine (ZYRTEC) 10 MG tablet Take 1 tablet (10 mg total) by mouth at bedtime. 90 tablet 2  . cromolyn (NASALCROM) 5.2 MG/ACT nasal spray Place 1 spray into both nostrils 4 (four) times daily. (Patient not taking: Reported on 11/12/2018) 26 mL 12  . iron polysaccharides (NIFEREX) 150 MG capsule Take 1 capsule (150 mg total) by mouth daily. (Patient not taking: Reported on 11/24/2018) 45 capsule 0  . megestrol (MEGACE) 40 MG tablet Take 1 tablet (40 mg total) by mouth 2 (two) times daily. (Patient not taking: Reported on 11/24/2018) 60 tablet 5   No facility-administered medications prior to visit.     Allergies  Allergen Reactions  . Aripiprazole Other (See Comments)    Weight gain  . Grass Extracts [Gramineae Pollens]   . Peanut (Diagnostic)   . Risperidone Other (See Comments)    lactation  . Soy Allergy   . Seroquel [Quetiapine Fumerate] Rash       Objective:    BP 117/76 (BP Location: Left Arm, Patient Position: Sitting, Cuff Size: Large)   Pulse 82   Temp 98.9 F (37.2 C) (Oral)   Ht 5\' 1"  (1.549 m)   Wt 228 lb (103.4 kg)   SpO2 98%   BMI 43.08 kg/m  Wt Readings from Last 3 Encounters:  12/26/18 228 lb (103.4 kg)  11/12/18 230 lb (104.3 kg)  08/27/16 240 lb (108.9 kg)    Physical Exam Constitutional:      Appearance: She is well-developed.  HENT:     Head: Normocephalic and atraumatic.     Right Ear: Tympanic membrane, ear canal and external ear normal.     Left Ear: Tympanic membrane, ear canal and external ear normal.     Nose: Nose normal.     Mouth/Throat:     Mouth: Mucous membranes are moist.     Pharynx: No oropharyngeal exudate.  Eyes:     General: No scleral  icterus.       Right eye: No discharge.     Conjunctiva/sclera: Conjunctivae normal.     Pupils: Pupils are equal, round, and reactive to light.  Neck:     Musculoskeletal: Normal range of motion and neck supple.     Thyroid: No thyromegaly.     Trachea: No tracheal deviation.  Cardiovascular:     Rate and Rhythm: Normal rate and regular rhythm.     Heart sounds: Normal heart sounds. No murmur. No friction rub.  Pulmonary:     Effort: Pulmonary effort is normal. No accessory muscle usage or respiratory distress.     Breath sounds: Normal breath sounds. No decreased breath sounds, wheezing,  rhonchi or rales.  Chest:     Chest wall: No tenderness.     Breasts: Breasts are symmetrical.        Right: No inverted nipple, mass, nipple discharge, skin change or tenderness.        Left: No inverted nipple, mass, nipple discharge, skin change or tenderness.  Abdominal:     General: Abdomen is protuberant. Bowel sounds are normal. There is no distension.     Palpations: Abdomen is soft. There is no mass.     Tenderness: There is no abdominal tenderness. There is no guarding or rebound.  Musculoskeletal: Normal range of motion.        General: No tenderness or deformity.  Lymphadenopathy:     Cervical: No cervical adenopathy.     Upper Body:     Right upper body: No supraclavicular, axillary or pectoral adenopathy.     Left upper body: No supraclavicular, axillary or pectoral adenopathy.  Skin:    General: Skin is warm and dry.     Findings: No erythema.  Neurological:     Mental Status: She is alert and oriented to person, place, and time.     Cranial Nerves: No cranial nerve deficit.     Coordination: Coordination normal.     Deep Tendon Reflexes: Reflexes are normal and symmetric.  Psychiatric:        Attention and Perception: Attention normal.        Mood and Affect: Mood and affect normal.        Speech: Speech normal.        Behavior: Behavior normal.        Thought Content:  Thought content normal.        Cognition and Memory: Cognition and memory normal.        Judgment: Judgment normal.        Patient has been counseled extensively about nutrition and exercise as well as the importance of adherence with medications and regular follow-up. The patient was given clear instructions to go to ER or return to medical center if symptoms don't improve, worsen or new problems develop. The patient verbalized understanding.   Follow-up: No follow-ups on file.   Gildardo Pounds, FNP-BC [facility], [place]   12/26/2018, 6:25 PM

## 2018-12-26 NOTE — Patient Instructions (Addendum)
Biotin up to 50,000 units daily  You can take liquid iron instead of pills to help with constipation [facility] Address: [address], [place], [place] [address] Open ? Closes 6PM [phone]  CALL GYN regarding your aygestin

## 2018-12-30 ENCOUNTER — Other Ambulatory Visit: Payer: Self-pay | Admitting: Obstetrics and Gynecology

## 2019-01-05 ENCOUNTER — Telehealth: Payer: Self-pay | Admitting: Obstetrics and Gynecology

## 2019-01-05 NOTE — Progress Notes (Signed)
HEMATOLOGY/ONCOLOGY CONSULTATION NOTE  Date of Service: 01/05/2019  Patient Care Team: Claiborne RiggFleming, Zelda W, NP as PCP - General (Nurse Practitioner)  CHIEF COMPLAINTS/PURPOSE OF CONSULTATION:  Elevated VIII Factor  HISTORY OF PRESENTING ILLNESS:   Tricia Scott is a wonderful 23 y.o. female who has been referred to us by Dr Billey Goslingharlie Lonia SkinnerPickens Jr for evaluation and management of elevated VIII factor. The pt reports that she is doing well overall.    The pt reports that she was 9 when she began menstruating and her periods were heavy even then. She would be extremely fatigued and go through about 40 period products per day. Her periods have gotten worse as she's gotten older, even more so in the last year. She now experiences headaches, dizziness, more severe fatigue, heavier bleeding and is often cold. Her periods last 7 days and are heavy for 5 of those days. She has never had IV iron or any blood transfusions. Her OBGYN did consider IV iron but decided to give her PO iron, which she has been taking for about 2 weeks. She was initially given iron in a pill form but it was upsetting her stomach so she was then given liquid iron which she takes with orange juice and has had no issues with. The iron supplement has also increased her appetite. Her periods have recently become irregular and her hormones unbalanced but her PCP told her to allow them time to regulate themselves. Her OBGYN has never given any indication for the cause of her excessive bleeding including: thyroid disorders, medication disorders, hormonal issues. Pt has had a uterine US but has had no biopsy.   Pt was placed on Megace to stop her menorrhagia. She took it for a couple of weeks but stopped as she experienced two episodes of chest pains. She went to the ER after the second episode and they determined that she had an Inflamed wall cavity. She has not taken Megace in several months. She has continued to take norethindrone as  prescribed, as well as her mental health medication and also takes OTC Biotin.   Pt has no history of blood clots and no family history of blood clots. There are no known bleeding or clotting disorders that run in her family, except for her mother who experiences menorrhagia and excessive nosebleeds. Her mother had a nose cautery for her nosebleeds. Pt states that she also used to have heavy nosebleeds but they stopped 4 years ago with no additional intervention.  She has had a wisdom tooth removed and had no subsequent bleeding issues. Pt denies any major bleeding incidents and has no history of pregnancy. Pt reports that she does crave ice occasionally. Pt drinks a lot of water and is a non smoker. Her weight has remained stable and she is not currently doing any type of walking. She has recently ordered a fitness machine and plans to begin working out.   Most recent lab results shows: 11/12/2018 aPTT at 26 11/12/2018 Fibrinogen at 568 11/12/2018 Protime-INR shows: INR at 1.0, Prothrombin Time at 10.8 11/12/2018 Coag Studies Interp Report revealed "The VWF:Ag is normal. The VWF:RCo is normal. The FVIII is elevated." 11/12/2018 Von Willebrand Panel is as follows: Factor VIII Activity at 188, Von Willebrand Ag at 129, Von Willebrand Factor at 66.  12/03/2018 Factor 8 assay at 187   On review of systems, pt reports hot flashes, dizziness, fatigue, chills, headaches and denies fevers, abdominal pain, unexpected weight loss and any other symptoms.  On PMHx the pt reports Menorrhagia, PTSD, Schizo-affective disorder  On Social Hx the pt reports she is a non-smoker On Family Hx the pt reports her mother has menorrhagia and heavy nosebleeds that required a nose cautery   MEDICAL HISTORY:  Past Medical History:  Diagnosis Date  . Abnormal uterine bleeding (AUB)   . Allergy   . Asthma   . Obesity   . PTSD (post-traumatic stress disorder)   . Schizo-affective psychosis (HCC)   . Sprains and  strains of ankle and foot 12/15/2011   Right side; occurred on Wednesday 12/12/2011  . Urinary tract infection     SURGICAL HISTORY: Past Surgical History:  Procedure Laterality Date  . NO PAST SURGERIES    . wisdom tooth removal      SOCIAL HISTORY: Social History   Socioeconomic History  . Marital status: Single    Spouse name: Not on file  . Number of children: Not on file  . Years of education: Not on file  . Highest education level: Not on file  Occupational History  . Not on file  Social Needs  . Financial resource strain: Not on file  . Food insecurity    Worry: Not on file    Inability: Not on file  . Transportation needs    Medical: Not on file    Non-medical: Not on file  Tobacco Use  . Smoking status: Never Smoker  . Smokeless tobacco: Never Used  Substance and Sexual Activity  . Alcohol use: No  . Drug use: No  . Sexual activity: Never    Birth control/protection: None  Lifestyle  . Physical activity    Days per week: Not on file    Minutes per session: Not on file  . Stress: Not on file  Relationships  . Social Musician on phone: Not on file    Gets together: Not on file    Attends religious service: Not on file    Active member of club or organization: Not on file    Attends meetings of clubs or organizations: Not on file    Relationship status: Not on file  . Intimate partner violence    Fear of current or ex partner: Not on file    Emotionally abused: Not on file    Physically abused: Not on file    Forced sexual activity: Not on file  Other Topics Concern  . Not on file  Social History Narrative  . Not on file    FAMILY HISTORY: Family History  Problem Relation Age of Onset  . Diabetes Mother   . Diabetes type II Mother   . Fibromyalgia Mother   . Hypertension Mother   . Mental illness Father   . Mental illness Sister     ALLERGIES:  is allergic to aripiprazole; grass extracts [gramineae pollens]; peanut  (diagnostic); risperidone; soy allergy; and seroquel [quetiapine fumerate].  MEDICATIONS:  Current Outpatient Medications  Medication Sig Dispense Refill  . acetaminophen (TYLENOL) 500 MG tablet Take 1 tablet (500 mg total) by mouth every 6 (six) hours as needed. 30 tablet 0  . albuterol (ACCUNEB) 0.63 MG/3ML nebulizer solution Take 1 ampule by nebulization every 6 (six) hours as needed for wheezing or shortness of breath.     . cetirizine (ZYRTEC) 10 MG tablet Take 1 tablet (10 mg total) by mouth at bedtime. 90 tablet 2  . cromolyn (NASALCROM) 5.2 MG/ACT nasal spray Place 1 spray into both nostrils 4 (four) times  daily. (Patient not taking: Reported on 11/12/2018) 26 mL 12  . docusate sodium (COLACE) 100 MG capsule Take 1 capsule (100 mg total) by mouth 2 (two) times daily as needed. 60 capsule 1  . ibuprofen (ADVIL) 600 MG tablet Take 1 tablet (600 mg total) by mouth every 6 (six) hours as needed for headache or cramping. 60 tablet 0  . iron polysaccharides (NIFEREX) 150 MG capsule Take 1 capsule (150 mg total) by mouth daily. (Patient not taking: Reported on 11/24/2018) 45 capsule 0  . megestrol (MEGACE) 40 MG tablet Take 1 tablet (40 mg total) by mouth 2 (two) times daily. (Patient not taking: Reported on 11/24/2018) 60 tablet 5  . norethindrone (AYGESTIN) 5 MG tablet TAKE 1 TABLET TWICE A DAY . MAY TAKE TWO TABLETS TWICE A DAY IF BLEEDING RETURNS 60 tablet 1   No current facility-administered medications for this visit.     REVIEW OF SYSTEMS:    10 Point review of Systems was done is negative except as noted above.  PHYSICAL EXAMINATION: ECOG PERFORMANCE STATUS: 1 - Symptomatic but completely ambulatory  . Vitals:   01/06/19 1247  BP: 125/79  Pulse: 81  Resp: 18  Temp: 99.8 F (37.7 C)  SpO2: 100%   Filed Weights   01/06/19 1247  Weight: 229 lb 3.2 oz (104 kg)   .Body mass index is 43.31 kg/m.  GENERAL:alert, in no acute distress and comfortable SKIN: no acute rashes, no  significant lesions EYES: conjunctiva are pink and non-injected, sclera anicteric OROPHARYNX: MMM, no exudates, no oropharyngeal erythema or ulceration NECK: supple, no JVD LYMPH:  no palpable lymphadenopathy in the cervical, axillary or inguinal regions LUNGS: clear to auscultation b/l with normal respiratory effort HEART: regular rate & rhythm ABDOMEN:  normoactive bowel sounds , non tender, not distended. Extremity: no pedal edema PSYCH: alert & oriented x 3 with fluent speech NEURO: no focal motor/sensory deficits  LABORATORY DATA:  I have reviewed the data as listed  . CBC Latest Ref Rng & Units 01/06/2019 11/06/2018 10/31/2018  WBC 4.0 - 10.5 K/uL 11.6(H) 10.8(H) 9.4  Hemoglobin 12.0 - 15.0 g/dL 3.5(C) 4.8(L) 8.5(T)  Hematocrit 36.0 - 46.0 % 32.1(L) 33.0(L) 31.3(L)  Platelets 150 - 400 K/uL 399 385 358    . CMP Latest Ref Rng & Units 01/06/2019 11/06/2018 04/29/2016  Glucose 70 - 99 mg/dL 093(J) 121(K) 244(C)  BUN 6 - 20 mg/dL 8 12 8   Creatinine 0.44 - 1.00 mg/dL 9.50 7.22 5.75  Sodium 135 - 145 mmol/L 140 135 138  Potassium 3.5 - 5.1 mmol/L 3.8 3.9 3.7  Chloride 98 - 111 mmol/L 106 103 105  CO2 22 - 32 mmol/L 26 22 26   Calcium 8.9 - 10.3 mg/dL 8.9 0.5(X) 8.3(F)  Total Protein 6.5 - 8.1 g/dL 7.5 - -  Total Bilirubin 0.3 - 1.2 mg/dL 5.8(I) - -  Alkaline Phos 38 - 126 U/L 87 - -  AST 15 - 41 U/L 12(L) - -  ALT 0 - 44 U/L 7 - -   . Lab Results  Component Value Date   IRON 19 (L) 01/06/2019   TIBC 346 01/06/2019   IRONPCTSAT 6 (L) 01/06/2019   (Iron and TIBC)  Lab Results  Component Value Date   FERRITIN <4 (L) 01/06/2019   Component     Latest Ref Rng & Units 01/06/2019  Iron     41 - 142 ug/dL 19 (L)  TIBC     518 - 444 ug/dL 984  Saturation Ratios     21 - 57 % 6 (L)  UIBC     120 - 384 ug/dL [id number]  Prothrombin Time     11.4 - 15.2 seconds 14.0  INR     0.8 - 1.2 1.1  Ferritin     11 - 307 ng/mL <4 (L)  Vitamin B12     180 - 914 pg/mL 317  APTT      24 - 36 seconds 31  Fibrinogen     210 - 475 mg/dL 914541 (H)  Factor XIII, Qualitative     No Lysis/24 NORMAL     RADIOGRAPHIC STUDIES: I have personally reviewed the radiological images as listed and agreed with the findings in the report. No results found.  ASSESSMENT & PLAN:   1) Elevated Factor VIII levels. Incidentally noted as a part of w/u for menorrhagia likely due to DUB No h/o VTE Likely reactively increased in the setting of menorrhagia with severe iron deficiency. Cannot r/o familial elevation of FVIII level though patient does not have a fhx of VTE  2) Severe iron deficiency related to Menorrhagia  PLAN -Discussed patient's most recent labs from 11/12/2018, aPTT at 26, Fibrinogen at 568, Protime-INR shows: INR at 1.0, Prothrombin Time at 10.8, Coag Studies Interp Report revealed "The VWF:Ag is normal. The VWF:RCo is normal. The FVIII is elevated.", Von Willebrand Panel is as follows: Factor VIII Activity at 188, Von Willebrand Ag at 129, Von Willebrand Factor at 5966. -Discussed 12/03/2018 Factor 8 assay at 187 Could create a risk of blood clots  -Advised that increased Factor VIII could be due to her ongoing menorrhagia  -Advised pt that weight loss and staying physically active will help minimize risk of blood clots -Will defer to OBGYN for hormonal birth control to control her menorrhagia -Will get labs today  -IV injectafer weekly x 2 doses of severe Iron deficiency -Will see back in two months with labs   FOLLOW UP: Labs today  IV Injectafer weekly x 2 doses ASAP RTC with Dr Candise CheKale with labs in 2 months  . Orders Placed This Encounter  Procedures  . CBC with Differential/Platelet    Standing Status:   Future    Number of Occurrences:   1    Standing Expiration Date:   02/10/2020  . CMP (Cancer Center only)    Standing Status:   Future    Number of Occurrences:   1    Standing Expiration Date:   01/06/2020  . Ferritin    Standing Status:   Future    Number  of Occurrences:   1    Standing Expiration Date:   01/06/2020  . Iron and TIBC    Standing Status:   Future    Number of Occurrences:   1    Standing Expiration Date:   01/06/2020  . Vitamin B12    Standing Status:   Future    Number of Occurrences:   1    Standing Expiration Date:   01/06/2020  . APTT    Standing Status:   Future    Number of Occurrences:   1    Standing Expiration Date:   01/06/2020  . Protime-INR    Standing Status:   Future    Number of Occurrences:   1    Standing Expiration Date:   01/06/2020  . Fibrinogen    Standing Status:   Future    Number of Occurrences:   1  Standing Expiration Date:   01/06/2020  . Factor 13 activity    Standing Status:   Future    Number of Occurrences:   1    Standing Expiration Date:   01/06/2020     All of the patients questions were answered with apparent satisfaction. The patient knows to call the clinic with any problems, questions or concerns.  I spent 30 mins counseling the patient face to face. The total time spent in the appointment was 45 mins and more than 50% was on counseling and direct patient cares.    Sullivan Lone MD [place] AAHIVMS [facility] Hematology/Oncology Physician [facility]  (Office):       [phone] (Work cell):  [phone] (Fax):           [phone]  01/05/2019 8:38 PM  I, Yevette Edwards, am acting as a scribe for Dr. Sullivan Lone.   .I have reviewed the above documentation for accuracy and completeness, and I agree with the above. Brunetta Genera MD

## 2019-01-05 NOTE — Telephone Encounter (Signed)
Attempted to call patient with her follow-up appointment with [facility]. No answer, left voicemail with appointment information ( 11/25 @ 1:55). Patient instructed to give the office a call if she needs to cancel or reschedule. Reminder mailed.

## 2019-01-06 ENCOUNTER — Inpatient Hospital Stay: Payer: Medicare Other | Attending: Hematology | Admitting: Hematology

## 2019-01-06 ENCOUNTER — Telehealth: Payer: Self-pay | Admitting: Hematology

## 2019-01-06 ENCOUNTER — Inpatient Hospital Stay: Payer: Medicare Other

## 2019-01-06 ENCOUNTER — Other Ambulatory Visit: Payer: Self-pay

## 2019-01-06 VITALS — BP 125/79 | HR 81 | Temp 99.8°F | Resp 18 | Ht 61.0 in | Wt 229.2 lb

## 2019-01-06 DIAGNOSIS — F259 Schizoaffective disorder, unspecified: Secondary | ICD-10-CM | POA: Insufficient documentation

## 2019-01-06 DIAGNOSIS — E538 Deficiency of other specified B group vitamins: Secondary | ICD-10-CM

## 2019-01-06 DIAGNOSIS — R791 Abnormal coagulation profile: Secondary | ICD-10-CM | POA: Diagnosis present

## 2019-01-06 DIAGNOSIS — N924 Excessive bleeding in the premenopausal period: Secondary | ICD-10-CM

## 2019-01-06 DIAGNOSIS — D500 Iron deficiency anemia secondary to blood loss (chronic): Secondary | ICD-10-CM

## 2019-01-06 DIAGNOSIS — F4310 Post-traumatic stress disorder, unspecified: Secondary | ICD-10-CM | POA: Diagnosis not present

## 2019-01-06 LAB — FIBRINOGEN: Fibrinogen: 541 mg/dL — ABNORMAL HIGH (ref 210–475)

## 2019-01-06 LAB — CMP (CANCER CENTER ONLY)
ALT: 7 U/L (ref 0–44)
AST: 12 U/L — ABNORMAL LOW (ref 15–41)
Albumin: 3.6 g/dL (ref 3.5–5.0)
Alkaline Phosphatase: 87 U/L (ref 38–126)
Anion gap: 8 (ref 5–15)
BUN: 8 mg/dL (ref 6–20)
CO2: 26 mmol/L (ref 22–32)
Calcium: 8.9 mg/dL (ref 8.9–10.3)
Chloride: 106 mmol/L (ref 98–111)
Creatinine: 0.83 mg/dL (ref 0.44–1.00)
GFR, Est AFR Am: >60 mL/min (ref >60)
GFR, Estimated: >60 mL/min (ref >60)
Glucose, Bld: 101 mg/dL — ABNORMAL HIGH (ref 70–99)
Potassium: 3.8 mmol/L (ref 3.5–5.1)
Sodium: 140 mmol/L (ref 135–145)
Total Bilirubin: 0.2 mg/dL — ABNORMAL LOW (ref 0.3–1.2)
Total Protein: 7.5 g/dL (ref 6.5–8.1)

## 2019-01-06 LAB — IRON AND TIBC
Iron: 19 ug/dL — ABNORMAL LOW (ref 41–142)
Saturation Ratios: 6 % — ABNORMAL LOW (ref 21–57)
TIBC: 346 ug/dL (ref 236–444)
UIBC: 327 ug/dL (ref 120–384)

## 2019-01-06 LAB — CBC WITH DIFFERENTIAL/PLATELET
Abs Immature Granulocytes: 0.04 10*3/uL (ref 0.00–0.07)
Basophils Absolute: 0.1 10*3/uL (ref 0.0–0.1)
Basophils Relative: 0 %
Eosinophils Absolute: 0.6 10*3/uL — ABNORMAL HIGH (ref 0.0–0.5)
Eosinophils Relative: 5 %
HCT: 32.1 % — ABNORMAL LOW (ref 36.0–46.0)
Hemoglobin: 9.1 g/dL — ABNORMAL LOW (ref 12.0–15.0)
Immature Granulocytes: 0 %
Lymphocytes Relative: 31 %
Lymphs Abs: 3.6 10*3/uL (ref 0.7–4.0)
MCH: 20.4 pg — ABNORMAL LOW (ref 26.0–34.0)
MCHC: 28.3 g/dL — ABNORMAL LOW (ref 30.0–36.0)
MCV: 72 fL — ABNORMAL LOW (ref 80.0–100.0)
Monocytes Absolute: 0.6 10*3/uL (ref 0.1–1.0)
Monocytes Relative: 5 %
Neutro Abs: 6.7 10*3/uL (ref 1.7–7.7)
Neutrophils Relative %: 59 %
Platelets: 399 10*3/uL (ref 150–400)
RBC: 4.46 MIL/uL (ref 3.87–5.11)
RDW: 20.3 % — ABNORMAL HIGH (ref 11.5–15.5)
WBC: 11.6 10*3/uL — ABNORMAL HIGH (ref 4.0–10.5)
nRBC: 0 % (ref 0.0–0.2)

## 2019-01-06 LAB — PROTIME-INR
INR: 1.1 (ref 0.8–1.2)
Prothrombin Time: 14 seconds (ref 11.4–15.2)

## 2019-01-06 LAB — FERRITIN: Ferritin: <4 ng/mL — ABNORMAL LOW (ref 11–307)

## 2019-01-06 LAB — APTT: aPTT: 31 seconds (ref 24–36)

## 2019-01-06 LAB — VITAMIN B12: Vitamin B-12: 317 pg/mL (ref 180–914)

## 2019-01-06 NOTE — Patient Instructions (Signed)
Thank you for choosing [facility] to provide your oncology and hematology care.   Should you have questions after your visit to the [facility] ([facility]), please contact this office at [phone] between 8:30 AM and 4:30 PM. Voicemails left after 4:00 PM may not be returned until the following business day. Calls received after 4:30 PM will be answered by an off-site Nurse Triage Line.    Prescription Refills:  Please have your pharmacy contact us directly for most prescription requests.  Contact the office directly for refills of narcotics (pain medications). Allow 48-72 hours for refills.  Appointments: Please contact the [facility] scheduling department [phone] for questions regarding [facility] appointment scheduling.  Contact the schedulers with any scheduling changes so that your appointment can be rescheduled in a timely manner.   Central Scheduling for [place] [phone] - Call to schedule procedures such as PET scans, CT scans, MRI, Ultrasound, etc.  To afford each patient quality time with our providers, please arrive 30 minutes before your scheduled appointment time.  If you arrive late for your appointment, you may be asked to reschedule.  We strive to give you quality time with our providers, and arriving late affects you and other patients whose appointments are after yours. If you are a no show for multiple scheduled visits, you may be dismissed from the clinic at the providers discretion.     Resources: [facility] Social Workers [phone] for additional information on assistance programs --Anne Cunningham/Abigail Elmore  [place] DSS  [phone]: Information regarding food stamps, Medicaid, and utility assistance SCAT [phone]   [place] [employer]'s shared-ride transportation service for eligible riders who have a disability that prevents them from riding the fixed route bus.   Medicare Rights Center 800-333-4114 Helps people with  Medicare understand their rights and benefits, navigate the Medicare system, and secure the quality healthcare they deserve [employer] 800-227-2345 Assists patients locate various types of support and financial assistance Cancer Care: 1-800-813-HOPE (4673) Provides financial assistance, online support groups, medication/co-pay assistance.      

## 2019-01-06 NOTE — Telephone Encounter (Signed)
Scheduled per 09/22 los, patient received after visit summary and calender.  °

## 2019-01-06 NOTE — Telephone Encounter (Signed)
Returned patient's phone call regarding 09/29 appointment, per patient's request appointment time has been rescheduled.

## 2019-01-09 LAB — FACTOR 13 ACTIVITY: Factor XIII, Qualitative: NORMAL

## 2019-01-13 ENCOUNTER — Inpatient Hospital Stay: Payer: Medicare Other

## 2019-01-13 ENCOUNTER — Ambulatory Visit: Payer: Medicare Other

## 2019-01-13 ENCOUNTER — Other Ambulatory Visit: Payer: Self-pay

## 2019-01-13 VITALS — BP 124/79 | HR 86 | Temp 98.2°F | Resp 18

## 2019-01-13 DIAGNOSIS — R791 Abnormal coagulation profile: Secondary | ICD-10-CM | POA: Diagnosis not present

## 2019-01-13 DIAGNOSIS — D500 Iron deficiency anemia secondary to blood loss (chronic): Secondary | ICD-10-CM

## 2019-01-13 MED ORDER — ACETAMINOPHEN 325 MG PO TABS
ORAL_TABLET | ORAL | Status: AC
  Filled 2019-01-13: qty 2

## 2019-01-13 MED ORDER — LORATADINE 10 MG PO TABS
ORAL_TABLET | ORAL | Status: AC
  Filled 2019-01-13: qty 1

## 2019-01-13 MED ORDER — SODIUM CHLORIDE 0.9 % IV SOLN
750.0000 mg | Freq: Once | INTRAVENOUS | Status: AC
  Administered 2019-01-13: 750 mg via INTRAVENOUS
  Filled 2019-01-13: qty 15

## 2019-01-13 MED ORDER — ACETAMINOPHEN 325 MG PO TABS
650.0000 mg | ORAL_TABLET | Freq: Once | ORAL | Status: AC
  Administered 2019-01-13: 650 mg via ORAL

## 2019-01-13 MED ORDER — LORATADINE 10 MG PO TABS
10.0000 mg | ORAL_TABLET | Freq: Once | ORAL | Status: AC
  Administered 2019-01-13: 13:00:00 10 mg via ORAL

## 2019-01-13 MED ORDER — SODIUM CHLORIDE 0.9 % IV SOLN
Freq: Once | INTRAVENOUS | Status: AC
Start: 2019-01-13 — End: 2019-01-13
  Administered 2019-01-13: 13:00:00 via INTRAVENOUS
  Filled 2019-01-13: qty 250

## 2019-01-13 NOTE — Patient Instructions (Signed)
Ferric carboxymaltose injection What is this medicine? FERRIC CARBOXYMALTOSE (ferr-ik car-box-ee-mol-toes) is an iron complex. Iron is used to make healthy red blood cells, which carry oxygen and nutrients throughout the body. This medicine is used to treat anemia in people with chronic kidney disease or people who cannot take iron by mouth. This medicine may be used for other purposes; ask your health care provider or pharmacist if you have questions. COMMON BRAND NAME(S): Injectafer What should I tell my health care provider before I take this medicine? They need to know if you have any of these conditions:  high levels of iron in the blood  liver disease  an unusual or allergic reaction to iron, other medicines, foods, dyes, or preservatives  pregnant or trying to get pregnant  breast-feeding How should I use this medicine? This medicine is for infusion into a vein. It is given by a health care professional in a hospital or clinic setting. Talk to your pediatrician regarding the use of this medicine in children. Special care may be needed. Overdosage: If you think you have taken too much of this medicine contact a poison control center or emergency room at once. NOTE: This medicine is only for you. Do not share this medicine with others. What if I miss a dose? It is important not to miss your dose. Call your doctor or health care professional if you are unable to keep an appointment. What may interact with this medicine? Do not take this medicine with any of the following medications:  deferoxamine  dimercaprol  other iron products This list may not describe all possible interactions. Give your health care provider a list of all the medicines, herbs, non-prescription drugs, or dietary supplements you use. Also tell them if you smoke, drink alcohol, or use illegal drugs. Some items may interact with your medicine. What should I watch for while using this medicine? Visit your  doctor or health care professional regularly. Tell your doctor if your symptoms do not start to get better or if they get worse. You may need blood work done while you are taking this medicine. You may need to follow a special diet. Talk to your doctor. Foods that contain iron include: whole grains/cereals, dried fruits, beans, or peas, leafy green vegetables, and organ meats (liver, kidney). What side effects may I notice from receiving this medicine? Side effects that you should report to your doctor or health care professional as soon as possible:  allergic reactions like skin rash, itching or hives, swelling of the face, lips, or tongue  dizziness  facial flushing Side effects that usually do not require medical attention (report to your doctor or health care professional if they continue or are bothersome):  changes in taste  constipation  headache  nausea, vomiting  pain, redness, or irritation at site where injected This list may not describe all possible side effects. Call your doctor for medical advice about side effects. You may report side effects to FDA at 1-800-FDA-1088. Where should I keep my medicine? This drug is given in a hospital or clinic and will not be stored at home. NOTE: This sheet is a summary. It may not cover all possible information. If you have questions about this medicine, talk to your doctor, pharmacist, or health care provider.  2020 Elsevier/Gold Standard (2016-05-17 09:40:29)  Coronavirus (COVID-19) Are you at risk?  Are you at risk for the Coronavirus (COVID-19)?  To be considered HIGH RISK for Coronavirus (COVID-19), you have to meet the following   criteria:  . Traveled to [place], [place], [place], [place] or [place]; or in the [place] to [place], [place], [place], or [place]; and have fever, cough, and shortness of breath within the last 2 weeks of travel OR . Been in close contact with a person diagnosed with COVID-19 within the  last 2 weeks and have fever, cough, and shortness of breath . IF YOU DO NOT MEET THESE CRITERIA, YOU ARE CONSIDERED LOW RISK FOR COVID-19.  What to do if you are HIGH RISK for COVID-19?  . If you are having a medical emergency, call 911. . Seek medical care right away. Before you go to a doctor's office, urgent care or emergency department, call ahead and tell them about your recent travel, contact with someone diagnosed with COVID-19, and your symptoms. You should receive instructions from your physician's office regarding next steps of care.  . When you arrive at healthcare provider, tell the healthcare staff immediately you have returned from visiting [place], [place], [place], [place] or [place]; or traveled in the [place] to [place], [place], [place], or [place]; in the last two weeks or you have been in close contact with a person diagnosed with COVID-19 in the last 2 weeks.   . Tell the health care staff about your symptoms: fever, cough and shortness of breath. . After you have been seen by a medical provider, you will be either: o Tested for (COVID-19) and discharged home on quarantine except to seek medical care if symptoms worsen, and asked to  - Stay home and avoid contact with others until you get your results (4-5 days)  - Avoid travel on public transportation if possible (such as bus, train, or airplane) or o Sent to the Emergency Department by EMS for evaluation, COVID-19 testing, and possible admission depending on your condition and test results.  What to do if you are LOW RISK for COVID-19?  Reduce your risk of any infection by using the same precautions used for avoiding the common cold or flu:  . Wash your hands often with soap and warm water for at least 20 seconds.  If soap and water are not readily available, use an alcohol-based hand sanitizer with at least 60% alcohol.  . If coughing or sneezing, cover your mouth and nose by coughing or sneezing into the elbow  areas of your shirt or coat, into a tissue or into your sleeve (not your hands). . Avoid shaking hands with others and consider head nods or verbal greetings only. . Avoid touching your eyes, nose, or mouth with unwashed hands.  . Avoid close contact with people who are sick. . Avoid places or events with large numbers of people in one location, like concerts or sporting events. . Carefully consider travel plans you have or are making. . If you are planning any travel outside or inside the [place], visit the CDC's Travelers' Health webpage for the latest health notices. . If you have some symptoms but not all symptoms, continue to monitor at home and seek medical attention if your symptoms worsen. . If you are having a medical emergency, call 911.   ADDITIONAL HEALTHCARE OPTIONS FOR PATIENTS  [facility] / e-Visit: [email or web address]         [facility]: [phone]  [facility]: [phone]                   [facility]: [phone]   

## 2019-01-20 ENCOUNTER — Other Ambulatory Visit: Payer: Self-pay

## 2019-01-20 ENCOUNTER — Inpatient Hospital Stay: Payer: Medicare Other | Attending: Hematology

## 2019-01-20 ENCOUNTER — Ambulatory Visit: Payer: Medicare Other

## 2019-01-20 ENCOUNTER — Other Ambulatory Visit: Payer: Self-pay | Admitting: *Deleted

## 2019-01-20 VITALS — BP 124/74 | HR 75 | Temp 98.3°F | Resp 18

## 2019-01-20 DIAGNOSIS — D500 Iron deficiency anemia secondary to blood loss (chronic): Secondary | ICD-10-CM | POA: Diagnosis present

## 2019-01-20 DIAGNOSIS — N920 Excessive and frequent menstruation with regular cycle: Secondary | ICD-10-CM | POA: Insufficient documentation

## 2019-01-20 MED ORDER — SODIUM CHLORIDE 0.9 % IV SOLN
750.0000 mg | Freq: Once | INTRAVENOUS | Status: AC
  Administered 2019-01-20: 750 mg via INTRAVENOUS
  Filled 2019-01-20: qty 15

## 2019-01-20 MED ORDER — ACETAMINOPHEN 325 MG PO TABS
650.0000 mg | ORAL_TABLET | Freq: Once | ORAL | Status: AC
  Administered 2019-01-20: 650 mg via ORAL

## 2019-01-20 MED ORDER — LORATADINE 10 MG PO TABS
ORAL_TABLET | ORAL | Status: AC
  Filled 2019-01-20: qty 1

## 2019-01-20 MED ORDER — SODIUM CHLORIDE 0.9 % IV SOLN
Freq: Once | INTRAVENOUS | Status: AC
  Administered 2019-01-20: 10:00:00 via INTRAVENOUS
  Filled 2019-01-20: qty 250

## 2019-01-20 MED ORDER — LORATADINE 10 MG PO TABS
10.0000 mg | ORAL_TABLET | Freq: Once | ORAL | Status: AC
  Administered 2019-01-20: 10 mg via ORAL

## 2019-01-20 MED ORDER — ACETAMINOPHEN 325 MG PO TABS
ORAL_TABLET | ORAL | Status: AC
  Filled 2019-01-20: qty 2

## 2019-01-20 NOTE — Telephone Encounter (Signed)
Received a refill request for Niferex from [facility]. Will route to provider.  Linda,RN

## 2019-01-20 NOTE — Patient Instructions (Signed)

## 2019-01-20 NOTE — Progress Notes (Signed)
Pt reported that she had several days of diarrhea following her first injectafer infusion. Pt now reports constipation and abdominal pain. Dr. Irene Limbo notified, proceed w/ injectafer as schedule today. Pt educated to use imodium in diarrhea returns and to call for with any further questions or concerns.

## 2019-01-26 ENCOUNTER — Other Ambulatory Visit: Payer: Self-pay | Admitting: *Deleted

## 2019-01-26 NOTE — Progress Notes (Unsigned)
Requesting RX for Norethindrone be changed to 90 day RX- will forward to provider.  Jacques Navy

## 2019-03-04 ENCOUNTER — Telehealth: Payer: Self-pay | Admitting: Hematology

## 2019-03-04 NOTE — Telephone Encounter (Signed)
[facility] 11/23 lab/fu moved to 12/1. Not able to reach patient by phone or leave message. Schedule mailed.

## 2019-03-09 ENCOUNTER — Other Ambulatory Visit: Payer: Medicare Other

## 2019-03-09 ENCOUNTER — Ambulatory Visit: Payer: Medicare Other | Admitting: Hematology

## 2019-03-11 ENCOUNTER — Ambulatory Visit (INDEPENDENT_AMBULATORY_CARE_PROVIDER_SITE_OTHER): Payer: Medicare Other | Admitting: Obstetrics and Gynecology

## 2019-03-11 ENCOUNTER — Encounter: Payer: Self-pay | Admitting: Obstetrics and Gynecology

## 2019-03-11 ENCOUNTER — Other Ambulatory Visit: Payer: Self-pay

## 2019-03-11 VITALS — BP 123/88 | HR 71 | Wt 231.1 lb

## 2019-03-11 DIAGNOSIS — D500 Iron deficiency anemia secondary to blood loss (chronic): Secondary | ICD-10-CM

## 2019-03-11 DIAGNOSIS — R791 Abnormal coagulation profile: Secondary | ICD-10-CM

## 2019-03-11 DIAGNOSIS — N920 Excessive and frequent menstruation with regular cycle: Secondary | ICD-10-CM | POA: Diagnosis present

## 2019-03-11 NOTE — Progress Notes (Signed)
  Obstetrics and Gynecology Visit Return Patient Evaluation  Appointment Date: 03/11/2019  Primary Care Provider: [place], [place] for [facility]  Chief Complaint: follow up menorrhagia  History of Present Illness:  Tricia Scott is a 23 y.o. G0 (LMP 11/4) with the above CC. PMhx significant for elevated factor 8, anemia, bmi 40s.  Patient initially seen by me on 7/29 for new patient visit for continued heavy periods. (see prior notes). U/s and labs were neg except for anemia and elevated factor 8. Pt referred to heme who thought it could be reactionary to her heavy bleeding and anemia and deferred hormone management to gyn. She has been getting iv iron through them.  NSAIDs have helped in the past.   Interval History: Since that time, she stopped taking any po progestins to help with her periods for at least the last three cycles. LMP 11/4 and 3-5d and heavy and somewhat crampy and same for 10/4 and early September; pt denies any intermenstrual bleeding.   Review of Systems:  as noted in the History of Present Illness.  Patient Active Problem List   Diagnosis Date Noted  . Iron deficiency anemia due to chronic blood loss 01/06/2019  . Schizoaffective disorder ([place]) 11/30/2018  . 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 Anemia 11/12/2018  . Menorrhagia 11/12/2018  . BMI 40.0-44.9, adult ([place]) 11/12/2018  . 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 Generalized anxiety disorder 04/22/2011  . Oppositional defiant disorder 04/22/2011   Medications:  [name] had no medications administered during this visit. Current Outpatient Medications  Medication Sig Dispense Refill  . acetaminophen (TYLENOL) 500 MG tablet Take 1 tablet (500 mg total) by mouth every 6 (six) hours as needed. 30 tablet 0  . cetirizine (ZYRTEC) 10 MG tablet Take 1 tablet (10 mg total) by mouth at bedtime. 90 tablet 2  .  VIIBRYD 20 MG TABS Take 1 tablet by mouth daily. Once daily at bedtime.    Marland Kitchen albuterol (ACCUNEB) 0.63 MG/3ML nebulizer solution Take 1 ampule by nebulization every 6 (six) hours as needed for wheezing or shortness of breath.     . cromolyn (NASALCROM) 5.2 MG/ACT nasal spray Place 1 spray into both nostrils 4 (four) times daily. (Patient not taking: Reported on 03/11/2019) 26 mL 12   No current facility-administered medications for this visit.     Allergies: is allergic to aripiprazole; grass extracts [gramineae pollens]; peanut (diagnostic); risperidone; soy allergy; and seroquel [quetiapine fumerate].  Physical Exam:  BP 123/88   Pulse 71   Wt 231 lb 1.6 oz (104.8 kg)   LMP 02/18/2019 (LMP Unknown)   BMI 43.67 kg/m  Body mass index is 43.67 kg/m. General appearance: Well nourished, well developed female in no acute distress.  Neuro/Psych:  Normal mood and affect.     Assessment: pt stable  Plan: I told her that I woulnd't recommend estrogen containing options given risk of VTE. Since NSAIDs have worked in the past, recommend trying that first and then can consider other options such as mirena or depo provera. Merfenamic acid sent in   RTC: 64m  Durene Romans MD Attending Center for [employer] [facility])

## 2019-03-13 MED ORDER — MEFENAMIC ACID 250 MG PO CAPS: ORAL_CAPSULE | ORAL | 3 refills | Status: AC

## 2019-03-15 ENCOUNTER — Encounter (HOSPITAL_COMMUNITY): Payer: Self-pay

## 2019-03-15 ENCOUNTER — Ambulatory Visit (HOSPITAL_COMMUNITY)
Admission: EM | Admit: 2019-03-15 | Discharge: 2019-03-15 | Disposition: A | Discharge status: Home / Self Care | Payer: Medicare Other | Attending: Nurse Practitioner | Admitting: Nurse Practitioner

## 2019-03-15 ENCOUNTER — Other Ambulatory Visit: Payer: Self-pay

## 2019-03-15 DIAGNOSIS — R42 Dizziness and giddiness: Secondary | ICD-10-CM | POA: Diagnosis not present

## 2019-03-15 MED ORDER — MECLIZINE HCL 25 MG PO TABS: 25.0000 mg | ORAL_TABLET | Freq: Three times a day (TID) | ORAL | 0 refills | Status: AC | PRN

## 2019-03-15 NOTE — ED Provider Notes (Signed)
[place]    CSN: [id number] Arrival date & time: 03/15/19  1157      History   Chief Complaint Chief Complaint  Patient presents with   Dizziness    HPI Tricia Scott is a 23 y.o. female.   Subjective:  Tricia Scott is a 23 y.o. female who is here for evaluation of dizziness. The dizziness has been present for 2 days. She describes the symptoms as lightheadedness and near syncope. Symptoms are exacerbated by rapid head movements, rising from supine position, rising from squatting or sitting position and bending. She also complains of headache. She denies otalgia, tinnitus, unilateral weakness, facial/limb paraesthesias, vomiting or blurred vision. She has been treated with nothing.  The following portions of the patient's history were reviewed and updated as appropriate: allergies, current medications, past family history, past medical history, past social history, past surgical history and problem list.         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   Anemia 11/12/2018   Menorrhagia 11/12/2018   BMI 40.0-44.9, adult ([place]) 11/12/2018   Moderate recurrent major depression ([place]) 04/22/2011   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 Authorizing Provider  acetaminophen (TYLENOL) 500 MG tablet Take 1 tablet (500 mg total) by mouth every 6 (six) hours as needed. 01/12/18   Law, Bea Graff, PA-C  albuterol (ACCUNEB) 0.63 MG/3ML nebulizer solution Take 1 ampule by nebulization every 6 (six) hours as needed for wheezing or shortness of breath.     [provider]  cetirizine (ZYRTEC) 10 MG tablet Take 1 tablet (10 mg total) by mouth at bedtime. 09/24/18 03/11/19  Gildardo Pounds, NP  ibuprofen (ADVIL) 600 MG tablet Take 1 tablet (600 mg total) by mouth every 6 (six) hours as needed for headache or cramping. 09/24/18   Gildardo Pounds, NP  meclizine (ANTIVERT) 25 MG tablet Take 1 tablet (25 mg total) by mouth 3 (three) times daily as needed for dizziness. 03/15/19   Enrique Sack, FNP  Mefenamic Acid 250 MG CAPS 500mg  po at start of period symptoms and then 250mg  po q6h for up to 3 days 03/13/19   Aletha Halim, MD  VIIBRYD 20 MG TABS Take 1 tablet by mouth daily. Once daily at bedtime. 02/24/19  [provider]  cromolyn (NASALCROM) 5.2 MG/ACT nasal spray Place 1 spray into both nostrils 4 (four) times daily. Patient not taking: Reported on 03/11/2019 09/24/18 03/15/19  Claiborne Rigg, NP  iron polysaccharides (NIFEREX) 150 MG capsule Take 1 capsule (150 mg total) by mouth daily. Patient not taking: Reported on 11/24/2018 11/12/18 03/15/19  Aplington Bing, MD  norethindrone (AYGESTIN) 5 MG tablet TAKE 1 TABLET TWICE A DAY . MAY TAKE TWO TABLETS TWICE A DAY IF BLEEDING RETURNS Patient not taking: Reported on 03/11/2019 12/31/18 03/15/19   Bing, MD    Family History Family History  Problem Relation Age of Onset   Diabetes Mother    Diabetes type II Mother    Fibromyalgia Mother     Hypertension Mother    Mental illness Father    Mental illness Sister     Social History Social History   Tobacco Use   Smoking status: Never Smoker   Smokeless tobacco: Never Used  Substance Use Topics   Alcohol use: No   Drug use: No     Allergies   Aripiprazole, Grass extracts [gramineae pollens], Peanut (diagnostic), Risperidone, Soy allergy, and Seroquel [quetiapine fumerate]   Review of Systems Review of Systems  Constitutional: Negative for fever.  Gastrointestinal: Positive for nausea. Negative for vomiting.  Genitourinary: Negative for dysuria.  Musculoskeletal: Negative for neck pain and neck stiffness.  Neurological: Positive for dizziness and headaches. Negative for facial asymmetry, speech difficulty, weakness and numbness.  All other systems reviewed and are negative.    Physical Exam Triage Vital Signs ED Triage Vitals  Enc Vitals Group     BP 03/15/19 1249 (!) 137/94     Pulse Rate 03/15/19 1249 86     Resp 03/15/19 1249 18     Temp 03/15/19 1249 (!) 97 F (36.1 C)     Temp Source 03/15/19 1249 Oral     SpO2 03/15/19 1249 99 %     Weight --      Height --      Head Circumference --      Peak Flow --      Pain Score 03/15/19 1246 1     Pain Loc --      Pain Edu? --      Excl. in GC? --    Orthostatic VS for the past 24 hrs:  BP- Lying Pulse- Lying BP- Sitting Pulse- Sitting BP- Standing at 0 minutes Pulse- Standing at 0 minutes  03/15/19 1250 125/84 83 (!) 137/94 77 (!) 136/91 94    Updated Vital Signs BP (!) 137/94 (BP Location: Left Arm)    Pulse 86    Temp (!) 97 F (36.1 C) (Oral)    Resp 18    LMP 02/18/2019 (LMP Unknown)    SpO2 99%   Visual Acuity Right Eye Distance:   Left Eye Distance:   Bilateral Distance:    Right Eye Near:   Left Eye Near:    Bilateral Near:     Physical Exam Vitals signs reviewed.  Constitutional:      General: She is not in acute distress.    Appearance: Normal appearance. She is normal  weight. She is not ill-appearing, toxic-appearing or diaphoretic.  HENT:     Head: Normocephalic.     Right Ear: Tympanic membrane, ear canal and external ear normal.     Left Ear: Tympanic membrane, ear canal and external ear normal.  Eyes:     General: Vision grossly intact.  Extraocular Movements: Extraocular movements intact.     Right eye: No nystagmus.     Left eye: No nystagmus.     Conjunctiva/sclera: Conjunctivae normal.     Pupils: Pupils are equal, round, and reactive to light.  Neck:     Musculoskeletal: Normal range of motion and neck supple. No neck rigidity.  Cardiovascular:     Rate and Rhythm: Normal rate and regular rhythm.  Pulmonary:     Effort: Pulmonary effort is normal.     Breath sounds: Normal breath sounds.  Musculoskeletal: Normal range of motion.  Lymphadenopathy:     Cervical: No cervical adenopathy.  Skin:    General: Skin is warm and dry.  Neurological:     General: No focal deficit present.     Mental Status: She is alert and oriented to person, place, and time.     Cranial Nerves: Cranial nerves are intact.     Sensory: Sensation is intact.     Motor: Motor function is intact.     Coordination: Coordination is intact.     Gait: Gait is intact.  Psychiatric:        Mood and Affect: Mood normal.        Behavior: Behavior normal.      UC Treatments / Results  Labs (all labs ordered are listed, but only abnormal results are displayed) Labs Reviewed - No data to display  EKG   Radiology No results found.  Procedures Procedures (including critical care time)  Medications Ordered in UC Medications - No data to display  Initial Impression / Assessment and Plan / UC Course  I have reviewed the triage vital signs and the nursing notes.  Pertinent labs & imaging results that were available during my care of the patient were reviewed by me and considered in my medical decision making (see chart for details).    23 yo female  presenting with probable vertigo. No prior history. The nature of vertigo syndromes were discussed along with their usual course and treatment. Educational materials given and questions answered. Meclizine per medication orders. Follow-up with PCP.   Today's evaluation has revealed no signs of a dangerous process. Discussed diagnosis with patient and/or guardian. Patient and/or guardian aware of their diagnosis, possible red flag symptoms to watch out for and need for close follow up. Patient and/or guardian understands verbal and written discharge instructions. Patient and/or guardian comfortable with plan and disposition.  Patient and/or guardian has a clear mental status at this time, good insight into illness (after discussion and teaching) and has clear judgment to make decisions regarding their care  This care was provided during an unprecedented National Emergency due to the Novel Coronavirus (COVID-19) pandemic. COVID-19 infections and transmission risks place heavy strains on healthcare resources.  As this pandemic evolves, our facility, providers, and staff strive to respond fluidly, to remain operational, and to provide care relative to available resources and information. Outcomes are unpredictable and treatments are without well-defined guidelines. Further, the impact of COVID-19 on all aspects of urgent care, including the impact to patients seeking care for reasons other than COVID-19, is unavoidable during this national emergency. At this time of the global pandemic, management of patients has significantly changed, even for non-COVID positive patients given high local and regional COVID volumes at this time requiring high healthcare system and resource utilization. The standard of care for management of both COVID suspected and non-COVID suspected patients continues to change rapidly at the local, regional, national, and global  levels. This patient was worked up and treated to the best available  but ever changing evidence and resources available at this current time.   Documentation was completed with the aid of voice recognition software. Transcription may contain typographical errors.  Final Clinical Impressions(s) / UC Diagnoses   Final diagnoses:  Vertigo     Discharge Instructions      Avoid sudden body or head movements or certain positions   Avoid bending down if you feel dizzy.   Take medications as prescribed  Drink plenty of fluids   Avoid alcohol   Follow-up with your primary care doctor in about a week or sooner if needed     ED Prescriptions    Medication Sig Dispense Auth. Provider   meclizine (ANTIVERT) 25 MG tablet Take 1 tablet (25 mg total) by mouth 3 (three) times daily as needed for dizziness. 30 tablet Lurline IdolMurrill, Nasiah Polinsky, FNP     PDMP not reviewed this encounter.   Lurline IdolMurrill, Ivalee Strauser, [place]NP 03/15/19 1259

## 2019-03-15 NOTE — ED Triage Notes (Signed)
Pt presents with dizziness and nausea X 2 days. 

## 2019-03-15 NOTE — Discharge Instructions (Signed)
Avoid sudden body or head movements or certain positions  Avoid bending down if you feel dizzy.  Take medications as prescribed Drink plenty of fluids  Avoid alcohol  Follow-up with your primary care doctor in about a week or sooner if needed

## 2019-03-16 ENCOUNTER — Other Ambulatory Visit: Payer: Self-pay | Admitting: *Deleted

## 2019-03-16 DIAGNOSIS — R791 Abnormal coagulation profile: Secondary | ICD-10-CM

## 2019-03-16 DIAGNOSIS — D500 Iron deficiency anemia secondary to blood loss (chronic): Secondary | ICD-10-CM

## 2019-03-16 DIAGNOSIS — E538 Deficiency of other specified B group vitamins: Secondary | ICD-10-CM

## 2019-03-16 NOTE — Progress Notes (Signed)
HEMATOLOGY/ONCOLOGY CONSULTATION NOTE  Date of Service: 03/17/2019  Patient Care Team: Claiborne RiggFleming, Zelda W, NP as PCP - General (Nurse Practitioner)  CHIEF COMPLAINTS/PURPOSE OF CONSULTATION:  Elevated VIII Factor  HISTORY OF PRESENTING ILLNESS:   Tricia Scott is a wonderful 23 y.o. female who has been referred to us by Dr Billey Goslingharlie Lonia SkinnerPickens Jr for evaluation and management of elevated VIII factor. The pt reports that she is doing well overall.    The pt reports that she was 9 when she began menstruating and her periods were heavy even then. She would be extremely fatigued and go through about 40 period products per day. Her periods have gotten worse as she's gotten older, even more so in the last year. She now experiences headaches, dizziness, more severe fatigue, heavier bleeding and is often cold. Her periods last 7 days and are heavy for 5 of those days. She has never had IV iron or any blood transfusions. Her OBGYN did consider IV iron but decided to give her PO iron, which she has been taking for about 2 weeks. She was initially given iron in a pill form but it was upsetting her stomach so she was then given liquid iron which she takes with orange juice and has had no issues with. The iron supplement has also increased her appetite. Her periods have recently become irregular and her hormones unbalanced but her PCP told her to allow them time to regulate themselves. Her OBGYN has never given any indication for the cause of her excessive bleeding including: thyroid disorders, medication disorders, hormonal issues. Pt has had a uterine US but has had no biopsy.   Pt was placed on Megace to stop her menorrhagia. She took it for a couple of weeks but stopped as she experienced two episodes of chest pains. She went to the ER after the second episode and they determined that she had an Inflamed wall cavity. She has not taken Megace in several months. She has continued to take norethindrone as  prescribed, as well as her mental health medication and also takes OTC Biotin.   Pt has no history of blood clots and no family history of blood clots. There are no known bleeding or clotting disorders that run in her family, except for her mother who experiences menorrhagia and excessive nosebleeds. Her mother had a nose cautery for her nosebleeds. Pt states that she also used to have heavy nosebleeds but they stopped 4 years ago with no additional intervention.  She has had a wisdom tooth removed and had no subsequent bleeding issues. Pt denies any major bleeding incidents and has no history of pregnancy. Pt reports that she does crave ice occasionally. Pt drinks a lot of water and is a non smoker. Her weight has remained stable and she is not currently doing any type of walking. She has recently ordered a fitness machine and plans to begin working out.   Most recent lab results shows: 11/12/2018 aPTT at 26 11/12/2018 Fibrinogen at 568 11/12/2018 Protime-INR shows: INR at 1.0, Prothrombin Time at 10.8 11/12/2018 Coag Studies Interp Report revealed "The VWF:Ag is normal. The VWF:RCo is normal. The FVIII is elevated." 11/12/2018 Von Willebrand Panel is as follows: Factor VIII Activity at 188, Von Willebrand Ag at 129, Von Willebrand Factor at 66.  12/03/2018 Factor 8 assay at 187   On review of systems, pt reports hot flashes, dizziness, fatigue, chills, headaches and denies fevers, abdominal pain, unexpected weight loss and any other symptoms.  On PMHx the pt reports Menorrhagia, PTSD, Schizo-affective disorder  On Social Hx the pt reports she is a non-smoker On Family Hx the pt reports her mother has menorrhagia and heavy nosebleeds that required a nose cautery  INTERVAL HISTORY:   Tricia Scott is a wonderful 23 y.o. female who is here for evaluation and management of iron deficiency anemia.. The patient's last visit with [place] was on 01/06/2019. The pt reports that she is doing well overall.   The pt reports that she has been feeling well and has noticed a significant increase in energy. Pt has continued to have heavy periods. She and her OBGYN have continued to discuss birth control options. They feel that oral birth control may not work as well due to Interactions with her other medications. They are currently deciding if an IUD device is right for her. Pt reports diarrhea and brown nasal secretions after her first IV Iron transfusion. She had no issues with her second IV iron transfusion. She is also no longer having ice cravings. Pt has not had any issues tolerating liquid PO iron.   Lab results today (03/17/19) of CBC w/diff and CMP is as follows: all values are WNL except for RDW at 25.2, Glucose at 129, Creatinine at 8.7, Total Bilirubin at <0.2. 03/17/2019 Vitamin B12 at 286 03/17/2019 Ferritin at 177 03/17/2019 Iron and TIBC is as follows: Iron at 63, TIBC at 216, Sat Ratios at 29, UIBC at 154    On review of systems, pt denies fatigue, leg swelling and any other symptoms.   MEDICAL HISTORY:  Past Medical History:  Diagnosis Date  . Abnormal uterine bleeding (AUB)   . Allergy   . Asthma   . Obesity   . PTSD (post-traumatic stress disorder)   . Schizo-affective psychosis ([place])   . Sprains and strains of ankle and foot 12/15/2011   Right side; occurred on Wednesday 12/12/2011  . Urinary tract infection     SURGICAL HISTORY: Past Surgical History:  Procedure Laterality Date  . WISDOM TOOTH EXTRACTION      SOCIAL HISTORY: Social History   Socioeconomic History  . Marital status: Single    Spouse name: Not on file  . Number of children: Not on file  . Years of education: Not on file  . Highest education level: Not on file  Occupational History  . Not on file  Social Needs  . Financial resource strain: Not on file  . Food insecurity    Worry: Not on file    Inability: Not on file  . Transportation needs    Medical: Not on file    Non-medical: Not on file   Tobacco Use  . Smoking status: Never Smoker  . Smokeless tobacco: Never Used  Substance and Sexual Activity  . Alcohol use: No  . Drug use: No  . Sexual activity: Never    Birth control/protection: None  Lifestyle  . Physical activity    Days per week: Not on file    Minutes per session: Not on file  . Stress: Not on file  Relationships  . Social Herbalist on phone: Not on file    Gets together: Not on file    Attends religious service: Not on file    Active member of club or organization: Not on file    Attends meetings of clubs or organizations: Not on file    Relationship status: Not on file  . Intimate partner violence  Fear of current or ex partner: Not on file    Emotionally abused: Not on file    Physically abused: Not on file    Forced sexual activity: Not on file  Other Topics Concern  . Not on file  Social History Narrative  . Not on file    FAMILY HISTORY: Family History  Problem Relation Age of Onset  . Diabetes Mother   . Diabetes type II Mother   . Fibromyalgia Mother   . Hypertension Mother   . Mental illness Father   . Mental illness Sister     ALLERGIES:  is allergic to aripiprazole; grass extracts [gramineae pollens]; peanut (diagnostic); risperidone; soy allergy; and seroquel [quetiapine fumerate].  MEDICATIONS:  Current Outpatient Medications  Medication Sig Dispense Refill  . acetaminophen (TYLENOL) 500 MG tablet Take 1 tablet (500 mg total) by mouth every 6 (six) hours as needed. 30 tablet 0  . albuterol (ACCUNEB) 0.63 MG/3ML nebulizer solution Take 1 ampule by nebulization every 6 (six) hours as needed for wheezing or shortness of breath.     . cetirizine (ZYRTEC) 10 MG tablet Take 1 tablet (10 mg total) by mouth at bedtime. 90 tablet 2  . ibuprofen (ADVIL) 600 MG tablet Take 1 tablet (600 mg total) by mouth every 6 (six) hours as needed for headache or cramping. 60 tablet 0  . meclizine (ANTIVERT) 25 MG tablet Take 1 tablet (25  mg total) by mouth 3 (three) times daily as needed for dizziness. 30 tablet 0  . Mefenamic Acid 250 MG CAPS  po at start of period symptoms and then  po q6h for up to 3 days 39 capsule 3  . VIIBRYD 20 MG TABS Take 1 tablet by mouth daily. Once daily at bedtime.     No current facility-administered medications for this visit.     REVIEW OF SYSTEMS:   A 10+ POINT REVIEW OF SYSTEMS WAS OBTAINED including neurology, dermatology, psychiatry, cardiac, respiratory, lymph, extremities, GI, GU, Musculoskeletal, constitutional, breasts, reproductive, HEENT.  All pertinent positives are noted in the HPI.  All others are negative.   PHYSICAL EXAMINATION: ECOG PERFORMANCE STATUS: 1 - Symptomatic but completely ambulatory  . Vitals:   03/17/19 1423  BP: (!) 130/99  Pulse: 87  Resp: 18  Temp: 98.3 F (36.8 C)  SpO2: 99%   Filed Weights   03/17/19 1423  Weight: 236 lb 6.4 oz (107.2 kg)   .Body mass index is 44.67 kg/m.   GENERAL:alert, in no acute distress and comfortable SKIN: no acute rashes, no significant lesions EYES: conjunctiva are pink and non-injected, sclera anicteric OROPHARYNX: MMM, no exudates, no oropharyngeal erythema or ulceration NECK: supple, no JVD LYMPH:  no palpable lymphadenopathy in the cervical, axillary or inguinal regions LUNGS: clear to auscultation b/l with normal respiratory effort HEART: regular rate & rhythm ABDOMEN:  normoactive bowel sounds , non tender, not distended. No palpable hepatosplenomegaly.  Extremity: no pedal edema PSYCH: alert & oriented x 3 with fluent speech NEURO: no focal motor/sensory deficits  LABORATORY DATA:  I have reviewed the data as listed  . CBC Latest Ref Rng & Units 03/17/2019 01/06/2019 11/06/2018  WBC 4.0 - 10.5 K/uL 9.4 11.6(H) 10.8(H)  Hemoglobin 12.0 - 15.0 g/dL 45.4 9.1(L) 9.4(L)  Hematocrit 36.0 - 46.0 % 39.2 32.1(L) 33.0(L)  Platelets 150 - 400 K/uL 231 399 385    . CMP Latest Ref Rng & Units 03/17/2019  01/06/2019 11/06/2018  Glucose 70 - 99 mg/dL 098(J) 191(Y) 782(N)  BUN  6 - 20 mg/dL Creatinine 0.44 - 1.00 mg/dL 3.24 4.01 0.27  Sodium 135 - 145 mmol/L 139 140 135  Potassium 3.5 - 5.1 mmol/L 3.8 3.8 3.9  Chloride 98 - 111 mmol/L 107 106 103  CO2 22 - 32 mmol/L Calcium 8.9 - 10.3 mg/dL 2.5(D) 8.9 6.6(Y)  Total Protein 6.5 - 8.1 g/dL 7.1 7.5 -  Total Bilirubin 0.3 - 1.2 mg/dL <4.0(H) 4.7(Q) -  Alkaline Phos 38 - 126 U/L 93 87 -  AST 15 - 41 U/L 22 12(L) -  ALT 0 - 44 U/L 24 7 -   . Lab Results  Component Value Date   IRON 63 03/17/2019   TIBC 216 (L) 03/17/2019   IRONPCTSAT 29 03/17/2019   (Iron and TIBC)  Lab Results  Component Value Date   FERRITIN 177 03/17/2019   Component     Latest Ref Rng & Units 01/06/2019  Iron     41 - 142 ug/dL 19 (L)  TIBC     259 - 444 ug/dL 563  Saturation Ratios     21 - 57 % 6 (L)  UIBC     120 - 384 ug/dL 875  Prothrombin Time     11.4 - 15.2 seconds 14.0  INR     0.8 - 1.2 1.1  Ferritin     11 - 307 ng/mL <4 (L)  Vitamin B12     180 - 914 pg/mL 317  APTT     24 - 36 seconds 31  Fibrinogen     210 - 475 mg/dL 643 (H)  Factor XIII, Qualitative     No Lysis/24 NORMAL     RADIOGRAPHIC STUDIES: I have personally reviewed the radiological images as listed and agreed with the findings in the report. No results found.  ASSESSMENT & PLAN:   1) Elevated Factor VIII levels. 11/12/2018 FVIII is elevated. Factor VIII Activity at 188, Von Willebrand Ag at 129, Von Willebrand Factor at 66. 12/03/2018 Factor 8 assay at 187 Incidentally noted as a part of w/u for menorrhagia likely due to DUB No h/o VTE Likely reactively increased in the setting of menorrhagia with severe iron deficiency. Cannot r/o familial elevation of FVIII level though patient does not have a fhx of VTE  2) Severe iron deficiency related to Menorrhagia s/p IV iron replacement.  PLAN: -Discussed pt labwork today, 03/17/19; all values are WNL  except for RDW at 25.2, Glucose at 129, Creatinine at 8.7, Total Bilirubin at <0.2. -Discussed 03/17/2019 Vitamin B12 at 286 -Discussed 03/17/2019 Ferritin at 177 -Discussed 03/17/2019 Iron and TIBC is as follows: Iron at 63, TIBC at 216, Sat Ratios at 29, UIBC at 154  -No indication for IV Iron at this time -Recommended pt take PO liquid Iron -Recommended pt f/u with OBGYN for menorrhagia  -PCP to follow anemia/iron deficiency in 4 months  -Will see back as needed  FOLLOW UP: Continue f/u with PCP in 4 months with labs RTC with Dr Candise Che if needed  The total time spent in the appt was 15 minutes and more than 50% was on counseling and direct patient cares.  All of the patient's questions were answered with apparent satisfaction. The patient knows to call the clinic with any problems, questions or concerns.   Wyvonnia Lora MD MS AAHIVMS [facility]ematology/Oncology Physician [facility]  (Office):       [phone] (Work cell):  [phone] (Fax):  [phone]  03/17/2019 5:05 PM  I, Carollee Herter, am acting as a Neurosurgeon for Dr. Wyvonnia Lora.   .I have reviewed the above documentation for accuracy and completeness, and I agree with the above. Johney Maine MD

## 2019-03-17 ENCOUNTER — Inpatient Hospital Stay: Payer: Medicare Other | Attending: Hematology

## 2019-03-17 ENCOUNTER — Other Ambulatory Visit: Payer: Self-pay

## 2019-03-17 ENCOUNTER — Inpatient Hospital Stay (HOSPITAL_BASED_OUTPATIENT_CLINIC_OR_DEPARTMENT_OTHER): Payer: Medicare Other | Admitting: Hematology

## 2019-03-17 VITALS — BP 130/99 | HR 87 | Temp 98.3°F | Resp 18 | Ht 61.0 in | Wt 236.4 lb

## 2019-03-17 DIAGNOSIS — D500 Iron deficiency anemia secondary to blood loss (chronic): Secondary | ICD-10-CM

## 2019-03-17 DIAGNOSIS — F259 Schizoaffective disorder, unspecified: Secondary | ICD-10-CM | POA: Insufficient documentation

## 2019-03-17 DIAGNOSIS — E538 Deficiency of other specified B group vitamins: Secondary | ICD-10-CM

## 2019-03-17 DIAGNOSIS — N920 Excessive and frequent menstruation with regular cycle: Secondary | ICD-10-CM | POA: Diagnosis not present

## 2019-03-17 DIAGNOSIS — R791 Abnormal coagulation profile: Secondary | ICD-10-CM

## 2019-03-17 LAB — FERRITIN: Ferritin: 177 ng/mL (ref 11–307)

## 2019-03-17 LAB — CBC WITH DIFFERENTIAL (CANCER CENTER ONLY)
Abs Immature Granulocytes: 0.03 10*3/uL (ref 0.00–0.07)
Basophils Absolute: 0 10*3/uL (ref 0.0–0.1)
Basophils Relative: 0 %
Eosinophils Absolute: 0.5 10*3/uL (ref 0.0–0.5)
Eosinophils Relative: 5 %
HCT: 39.2 % (ref 36.0–46.0)
Hemoglobin: 12.7 g/dL (ref 12.0–15.0)
Immature Granulocytes: 0 %
Lymphocytes Relative: 29 %
Lymphs Abs: 2.7 10*3/uL (ref 0.7–4.0)
MCH: 27 pg (ref 26.0–34.0)
MCHC: 32.4 g/dL (ref 30.0–36.0)
MCV: 83.2 fL (ref 80.0–100.0)
Monocytes Absolute: 0.5 10*3/uL (ref 0.1–1.0)
Monocytes Relative: 5 %
Neutro Abs: 5.7 10*3/uL (ref 1.7–7.7)
Neutrophils Relative %: 61 %
Platelet Count: 231 10*3/uL (ref 150–400)
RBC: 4.71 MIL/uL (ref 3.87–5.11)
RDW: 25.2 % — ABNORMAL HIGH (ref 11.5–15.5)
WBC Count: 9.4 10*3/uL (ref 4.0–10.5)
nRBC: 0 % (ref 0.0–0.2)

## 2019-03-17 LAB — CMP (CANCER CENTER ONLY)
ALT: 24 U/L (ref 0–44)
AST: 22 U/L (ref 15–41)
Albumin: 3.5 g/dL (ref 3.5–5.0)
Alkaline Phosphatase: 93 U/L (ref 38–126)
Anion gap: 10 (ref 5–15)
BUN: 7 mg/dL (ref 6–20)
CO2: 22 mmol/L (ref 22–32)
Calcium: 8.7 mg/dL — ABNORMAL LOW (ref 8.9–10.3)
Chloride: 107 mmol/L (ref 98–111)
Creatinine: 0.73 mg/dL (ref 0.44–1.00)
GFR, Est AFR Am: >60 mL/min (ref >60)
GFR, Estimated: >60 mL/min (ref >60)
Glucose, Bld: 129 mg/dL — ABNORMAL HIGH (ref 70–99)
Potassium: 3.8 mmol/L (ref 3.5–5.1)
Sodium: 139 mmol/L (ref 135–145)
Total Bilirubin: <0.2 mg/dL — ABNORMAL LOW (ref 0.3–1.2)
Total Protein: 7.1 g/dL (ref 6.5–8.1)

## 2019-03-17 LAB — IRON AND TIBC
Iron: 63 ug/dL (ref 41–142)
Saturation Ratios: 29 % (ref 21–57)
TIBC: 216 ug/dL — ABNORMAL LOW (ref 236–444)
UIBC: 154 ug/dL (ref 120–384)

## 2019-03-17 LAB — VITAMIN B12: Vitamin B-12: 286 pg/mL (ref 180–914)

## 2019-03-18 ENCOUNTER — Telehealth: Payer: Self-pay | Admitting: Hematology

## 2019-03-18 NOTE — Telephone Encounter (Signed)
Per 12/1 los Continue f/u with PCP in 4 months with labs RTC with Dr Irene Limbo if needed

## 2019-03-24 ENCOUNTER — Telehealth: Payer: Self-pay | Admitting: *Deleted

## 2019-03-24 NOTE — Telephone Encounter (Addendum)
Patient returned call after message from mother. Given information related B12 results as directed by Dr.Kale. She verbalized understanding. Pt updated phone # for records: [phone]. Chart updated.  ----- Message from Rolland Bimler, RN sent at 03/24/2019  9:59 AM EST ----- Attempted to contact patient. Number in chart is for mother, not pt. Mother stated she would give message for patient to call office for test results.  ----- Message ----- From: Brunetta Genera, MD Sent: 03/23/2019  10:58 PM EST To: Rolland Bimler, RN  B12 low [phone]--- recommend patient take OTC B12 1000 mcg po daily

## 2019-03-24 NOTE — Telephone Encounter (Signed)
Attempted to contact patient with B12 results (286-low) and directions from Dr. Irene Limbo to take OTC B12 1000 mcg po daily.. Chart lists 'home' # [phone]. Contacted this number, reached patient's mother who states this is not patient contact, but she did not have patient's number. Requested that if she spoke with patient she give patient message to contact Dr.Kale's office for lab test results. Mother stated she would tell patient when she next spoke with her.

## 2019-03-25 NOTE — Progress Notes (Signed)
Patient ID: Tricia Scott, female   DOB: 04-11-1996, 23 y.o.   MRN: [id number]   Virtual Visit via Telephone Note  I connected with Tricia Scott on 03/26/19 at 10:10 AM EST by telephone and verified that I am speaking with the correct person using two identifiers.   I discussed the limitations, risks, security and privacy concerns of performing an evaluation and management service by telephone and the availability of in person appointments. I also discussed with the patient that there may be a patient responsible charge related to this service. The patient expressed understanding and agreed to proceed.  Patient location:  home My Location:  [facility] office Persons on the call:  Me and the patient    History of Present Illness: Was seen in ED  03/15/2019 for vertigo.  Vertigo is resolving.  She is having a fair amount of nasal congestion and ear pressure.  She isn't taking anything for this.  No fever.  No cough.  No N/V.  Recently seen by heme/onc for ongoing anemia/facto VIII issues.    From ED note: 23 yo female presenting with probable vertigo. No prior history. The nature of vertigo syndromes were discussed along with their usual course and treatment. Educational materials given and questions answered. Meclizine per medication orders. Follow-up with PCP.   Today's evaluation has revealed no signs of a dangerous process. Discussed diagnosis with patient and/or guardian. Patient and/or guardian aware of their diagnosis, possible red flag symptoms to watch out for and need for close follow up. Patient and/or guardian understands verbal and written discharge instructions. Patient and/or guardian comfortable with plan and disposition.  Patient and/or guardian has a clear mental status at this time, good insight into illness (after discussion and teaching) and has clear judgment to make decisions regarding their care  Was seen 12/01 by heme onc. From A/P: ASSESSMENT & PLAN:   1) Elevated  Factor VIII levels. 11/12/2018 FVIII is elevated. Factor VIII Activity at 188, Von Willebrand Ag at 129, Von Willebrand Factor at 66. 12/03/2018 Factor 8 assay at 187 Incidentally noted as a part of w/u for menorrhagia likely due to DUB No h/o VTE Likely reactively increased in the setting of menorrhagia with severe iron deficiency. Cannot r/o familial elevation of FVIII level though patient does not have a fhx of VTE  2) Severe iron deficiency related to Menorrhagia s/p IV iron replacement.  PLAN: -Discussed pt labwork today, 03/17/19; all values are WNL except for RDW at 25.2, Glucose at 129, Creatinine at 8.7, Total Bilirubin at <0.2. -Discussed 03/17/2019 Vitamin B12 at 286 -Discussed 03/17/2019 Ferritin at 177 -Discussed 03/17/2019 Iron and TIBC is as follows: Iron at 63, TIBC at 216, Sat Ratios at 29, UIBC at 154  -No indication for IV Iron at this time -Recommended pt take PO liquid Iron -Recommended pt f/u with OBGYN for menorrhagia  -PCP to follow anemia/iron deficiency in 4 months  -Will see back as needed  FOLLOW UP: Continue f/u with PCP in 4 months with labs RTC with Dr Candise Che if needed     Observations/Objective:  NAD.  A&Ox3   Assessment and Plan: 1. Iron deficiency anemia due to chronic blood loss Continue with heme onc as palnned  2. Elevated factor VIII level See #1  3. Vertigo Resolving-use meclizine as needed  4. Encounter for examination following treatment at hospital improving  5. Dysfunction of Eustachian tube, unspecified laterality Likely contributing to #3. - fluticasone (FLONASE) 50 MCG/ACT nasal spray; Place 2 sprays  into both nostrils daily.  Dispense: 16 g; Refill: 6 Add sudafed  6.  Menorrhagia-refer to gyn  Follow Up Instructions: See PCP in 2-3 months   I discussed the assessment and treatment plan with the patient. The patient was provided an opportunity to ask questions and all were answered. The patient agreed with the plan  and demonstrated an understanding of the instructions.   The patient was advised to call back or seek an in-person evaluation if the symptoms worsen or if the condition fails to improve as anticipated.  I provided 15 minutes of non-face-to-face time during this encounter.   Freeman Caldron, PA-C

## 2019-03-26 ENCOUNTER — Ambulatory Visit: Payer: Medicare Other | Attending: Nurse Practitioner | Admitting: Physician Assistant

## 2019-03-26 ENCOUNTER — Other Ambulatory Visit: Payer: Self-pay

## 2019-03-26 DIAGNOSIS — N920 Excessive and frequent menstruation with regular cycle: Secondary | ICD-10-CM

## 2019-03-26 DIAGNOSIS — R791 Abnormal coagulation profile: Secondary | ICD-10-CM | POA: Diagnosis not present

## 2019-03-26 DIAGNOSIS — D500 Iron deficiency anemia secondary to blood loss (chronic): Secondary | ICD-10-CM

## 2019-03-26 DIAGNOSIS — R42 Dizziness and giddiness: Secondary | ICD-10-CM

## 2019-03-26 DIAGNOSIS — H6990 Unspecified Eustachian tube disorder, unspecified ear: Secondary | ICD-10-CM

## 2019-03-26 DIAGNOSIS — H6980 Other specified disorders of Eustachian tube, unspecified ear: Secondary | ICD-10-CM

## 2019-03-26 DIAGNOSIS — Z09 Encounter for follow-up examination after completed treatment for conditions other than malignant neoplasm: Secondary | ICD-10-CM

## 2019-03-26 MED ORDER — FLUTICASONE PROPIONATE 50 MCG/ACT NA SUSP: 2.0000 | Freq: Every day | NASAL | 6 refills | Status: AC

## 2019-04-13 ENCOUNTER — Telehealth: Payer: Self-pay | Admitting: *Deleted

## 2019-04-13 NOTE — Telephone Encounter (Signed)
Patient called - asked to speak with Dr. Irene Limbo. States that at last appt 12/1, her iron was ok, but since then she has had a heavy period. Now fatigues, skin dry and fingernails yellowed, so she thinks her iron may have dropped. Enocouraged her to keep taking B12 1058mcg daily as advised by Dr.Kale and to contact PCP for evaluation of symptoms as they could also be related to other factors. If her iron levels are low when evaluated by PCP - please contact this office. She verbalized understanding and states she has only been taking B12 1034mcg every other day, will start taking it daily.

## 2019-04-22 ENCOUNTER — Ambulatory Visit: Payer: Medicare Other | Attending: Nurse Practitioner | Admitting: Physician Assistant

## 2019-04-22 ENCOUNTER — Other Ambulatory Visit: Payer: Self-pay

## 2019-04-22 VITALS — BP 133/81 | HR 82 | Temp 98.7°F | Ht 61.0 in | Wt 236.0 lb

## 2019-04-22 DIAGNOSIS — R5383 Other fatigue: Secondary | ICD-10-CM

## 2019-04-22 DIAGNOSIS — D509 Iron deficiency anemia, unspecified: Secondary | ICD-10-CM

## 2019-04-22 DIAGNOSIS — N920 Excessive and frequent menstruation with regular cycle: Secondary | ICD-10-CM | POA: Diagnosis not present

## 2019-04-22 DIAGNOSIS — E559 Vitamin D deficiency, unspecified: Secondary | ICD-10-CM

## 2019-04-22 NOTE — Progress Notes (Signed)
Patient ID: Tricia Scott, female   DOB: [birth year], 24 y.o.   MRN: [id number]   Tricia Scott, is a 24 y.o. female  OYD:[id number]  [id number]:[id number]  DOB - 06/26/1995  Subjective:  Chief Complaint and HPI: Tricia Scott is a 24 y.o. female here today still having heavy, regular periods.  Wears depends pads during her period.  See gyn again Friday of this week.  So far has declined all treatment options to help with heavy periods. Her iron  Was low normal in the beginning of December.  She says she has been a lot more tired that usual.  Hair is brittle and breaking off/falling out.  Nails seem yellow.  On period now.  Taking vitamin b12 daily.   ROS:   Constitutional:  No f/c, No night sweats, No unexplained weight loss. EENT:  No vision changes, No blurry vision, No hearing changes. No mouth, throat, or ear problems.  Respiratory: No cough, No SOB Cardiac: No CP, no palpitations GI:  No abd pain, No N/V/D. GU: No Urinary s/sx Musculoskeletal: No joint pain Neuro: No headache, no dizziness, no motor weakness.  Skin: No rash Endocrine:  No polydipsia. No polyuria.  Psych: Denies SI/HI  No problems updated.  ALLERGIES: Allergies  Allergen Reactions  . Aripiprazole Other (See Comments)    Weight gain  . Grass Extracts [Gramineae Pollens]   . Peanut (Diagnostic)   . Risperidone Other (See Comments)    lactation  . Soy Allergy   . Seroquel [Quetiapine Fumerate] Rash    PAST MEDICAL HISTORY: Past Medical History:  Diagnosis Date  . Abnormal uterine bleeding (AUB)   . Allergy   . Asthma   . Obesity   . PTSD (post-traumatic stress disorder)   . Schizo-affective psychosis (HCC)   . Sprains and strains of ankle and foot 12/15/2011   Right side; occurred on Wednesday 12/12/2011  . Urinary tract infection     MEDICATIONS AT HOME: Prior to Admission medications   Medication Sig Start Date End Date Taking? Authorizing Provider  acetaminophen (TYLENOL) 500 MG tablet Take 1  tablet (500 mg total) by mouth every 6 (six) hours as needed. 01/12/18  Yes Law, Alexandra M, PA-C  Cyanocobalamin (VITAMIN B-12) 1000 MCG SUBL Place under the tongue.   Yes [provider]  fluticasone (FLONASE) 50 MCG/ACT nasal spray Place 2 sprays into both nostrils daily. 03/26/19  Yes Thaddus Mcdowell M, PA-C  VIIBRYD 20 MG TABS Take 1 tablet by mouth daily. Once daily at bedtime. 02/24/19  Yes [provider]  albuterol (ACCUNEB) 0.63 MG/3ML nebulizer solution Take 1 ampule by nebulization every 6 (six) hours as needed for wheezing or shortness of breath.     [provider]  cetirizine (ZYRTEC) 10 MG tablet Take 1 tablet (10 mg total) by mouth at bedtime. 09/24/18 03/11/19  Claiborne Rigg, NP  ibuprofen (ADVIL) 600 MG tablet Take 1 tablet (600 mg total) by mouth every 6 (six) hours as needed for headache or cramping. Patient not taking: Reported on 03/26/2019 09/24/18   Claiborne Rigg, NP  meclizine (ANTIVERT) 25 MG tablet Take 1 tablet (25 mg total) by mouth 3 (three) times daily as needed for dizziness. Patient not taking: Reported on 04/22/2019 03/15/19   Lurline Idol, FNP  Mefenamic Acid 250 MG CAPS 500mg  po at start of period symptoms and then 250mg  po q6h for up to 3 days Patient not taking: Reported on 03/26/2019 03/13/19   14/01/2019, MD  cromolyn (NASALCROM)  5.2 MG/ACT nasal spray Place 1 spray into both nostrils 4 (four) times daily. Patient not taking: Reported on 03/11/2019 09/24/18 03/15/19  Gildardo Pounds, NP  iron polysaccharides (NIFEREX) 150 MG capsule Take 1 capsule (150 mg total) by mouth daily. Patient not taking: Reported on 11/24/2018 11/12/18 03/15/19  Aletha Halim, MD  norethindrone (AYGESTIN) 5 MG tablet TAKE 1 TABLET TWICE A DAY . MAY TAKE TWO TABLETS TWICE A DAY IF BLEEDING RETURNS Patient not taking: Reported on 03/11/2019 12/31/18 03/15/19  Aletha Halim, MD     Objective:  EXAM:   Vitals:   04/22/19 1357  BP:  133/81  Pulse: 82  Temp: 98.7 F (37.1 C)  TempSrc: Oral  SpO2: 96%  Weight: 236 lb (107 kg)  Height: 5\' 1"  (1.549 m)    General appearance : A&OX3. NAD. Non-toxic-appearing HEENT: Atraumatic and Normocephalic.  PERRLA. EOM intact.   Neck: supple, no JVD. No cervical lymphadenopathy. No thyromegaly Chest/Lungs:  Breathing-non-labored, Good air entry bilaterally, breath sounds normal without rales, rhonchi, or wheezing  CVS: S1 S2 regular, no murmurs, gallops, rubs  Nails are normal without pitting or ridges Extremities: Bilateral Lower Ext shows no edema, both legs are warm to touch with = pulse throughout Neurology:  CN II-XII grossly intact, Non focal.   Psych:  TP linear. J/I WNL. Normal speech. Appropriate eye contact and affect.  Skin:  No Rash  Data Review Lab Results  Component Value Date   HGBA1C 6.1 (H) 04/22/2011   HGBA1C  09/25/2007    5.8 (NOTE)   The ADA recommends the following therapeutic goals for glycemic   control related to Hgb A1C measurement:   Goal of Therapy:   < 7.0% Hgb A1C   Action Suggested:  > 8.0% Hgb A1C   Ref:  Diabetes Care, 22, Suppl. 1, 1999   HGBA1C  05/18/2007    6.0 (NOTE)   The ADA recommends the following therapeutic goals for glycemic   control related to Hgb A1C measurement:   Goal of Therapy:   < 7.0% Hgb A1C   Action Suggested:  > 8.0% Hgb A1C   Ref:  Diabetes Care, 22, Suppl. 1, 1999     Assessment & Plan   1. Menorrhagia with regular cycle See gyn Friday as planned - Thyroid Panel With TSH - Vitamin D, 25-hydroxy  2. Fatigue, unspecified type - Thyroid Panel With TSH - Vitamin D, 25-hydroxy  3. Iron deficiency anemia, unspecified iron deficiency anemia type Since 2 very heavy periods since checked in December, I will go ahead and check this again. - CBC with Differential - Iron, TIBC and Ferritin Panel  4. Vitamin D deficiency - Vitamin D, 25-hydroxy   Patient have been counseled extensively about nutrition and  exercise  Return in about 3 months (around 07/21/2019) for pcp.  The patient was given clear instructions to go to ER or return to medical center if symptoms don't improve, worsen or new problems develop. The patient verbalized understanding. The patient was told to call to get lab results if they haven't heard anything in the next week.     Freeman Caldron, PA-C [facility] and [place], [place]   04/22/2019, 2:35 PM

## 2019-04-22 NOTE — Progress Notes (Deleted)
Virtual Visit via Telephone Note  I connected with Tricia Scott on 04/22/19 at  2:50 PM EST by telephone and verified that I am speaking with the correct person using two identifiers.   I discussed the limitations, risks, security and privacy concerns of performing an evaluation and management service by telephone and the availability of in person appointments. I also discussed with the patient that there may be a patient responsible charge related to this service. The patient expressed understanding and agreed to proceed.  Patient location:  home My Location:  [facility] office Persons on the call:     History of Present Illness:    Observations/Objective:   Assessment and Plan:   Follow Up Instructions:    I discussed the assessment and treatment plan with the patient. The patient was provided an opportunity to ask questions and all were answered. The patient agreed with the plan and demonstrated an understanding of the instructions.   The patient was advised to call back or seek an in-person evaluation if the symptoms worsen or if the condition fails to improve as anticipated.  I provided *** minutes of non-face-to-face time during this encounter.   Tricia Co, PA-C  Patient ID: Tricia Scott, female   DOB: 12/12/1995, 24 y.o.   MRN: [id number]

## 2019-04-23 LAB — IRON,TIBC AND FERRITIN PANEL
Ferritin: 179 ng/mL — ABNORMAL HIGH (ref 15–150)
Iron Saturation: 22 % (ref 15–55)
Iron: 60 ug/dL (ref 27–159)
Total Iron Binding Capacity: 267 ug/dL (ref 250–450)
UIBC: 207 ug/dL (ref 131–425)

## 2019-04-23 LAB — CBC WITH DIFFERENTIAL/PLATELET
Basophils Absolute: 0 10*3/uL (ref 0.0–0.2)
Basos: 0 %
EOS (ABSOLUTE): 0.4 10*3/uL (ref 0.0–0.4)
Eos: 3 %
Hematocrit: 42.9 % (ref 34.0–46.6)
Hemoglobin: 14.1 g/dL (ref 11.1–15.9)
Immature Grans (Abs): 0.1 10*3/uL (ref 0.0–0.1)
Immature Granulocytes: 1 %
Lymphocytes Absolute: 3.3 10*3/uL — ABNORMAL HIGH (ref 0.7–3.1)
Lymphs: 30 %
MCH: 29.1 pg (ref 26.6–33.0)
MCHC: 32.9 g/dL (ref 31.5–35.7)
MCV: 89 fL (ref 79–97)
Monocytes Absolute: 0.5 10*3/uL (ref 0.1–0.9)
Monocytes: 5 %
Neutrophils Absolute: 6.7 10*3/uL (ref 1.4–7.0)
Neutrophils: 61 %
Platelets: 293 10*3/uL (ref 150–450)
RBC: 4.84 x10E6/uL (ref 3.77–5.28)
RDW: 17.4 % — ABNORMAL HIGH (ref 11.7–15.4)
WBC: 10.9 10*3/uL — ABNORMAL HIGH (ref 3.4–10.8)

## 2019-04-23 LAB — VITAMIN D 25 HYDROXY (VIT D DEFICIENCY, FRACTURES): Vit D, 25-Hydroxy: 19.2 ng/mL — ABNORMAL LOW (ref 30.0–100.0)

## 2019-04-23 LAB — THYROID PANEL WITH TSH
Free Thyroxine Index: 1.8 (ref 1.2–4.9)
T3 Uptake Ratio: 27 % (ref 24–39)
T4, Total: 6.5 ug/dL (ref 4.5–12.0)
TSH: 0.934 u[IU]/mL (ref 0.450–4.500)

## 2019-04-24 ENCOUNTER — Ambulatory Visit (INDEPENDENT_AMBULATORY_CARE_PROVIDER_SITE_OTHER): Payer: Medicare Other | Admitting: Obstetrics and Gynecology

## 2019-04-24 ENCOUNTER — Other Ambulatory Visit: Payer: Self-pay | Admitting: Physician Assistant

## 2019-04-24 ENCOUNTER — Encounter: Payer: Self-pay | Admitting: Obstetrics and Gynecology

## 2019-04-24 ENCOUNTER — Other Ambulatory Visit: Payer: Self-pay

## 2019-04-24 VITALS — BP 123/84 | HR 86 | Ht 62.0 in | Wt 236.5 lb

## 2019-04-24 DIAGNOSIS — R791 Abnormal coagulation profile: Secondary | ICD-10-CM

## 2019-04-24 DIAGNOSIS — N920 Excessive and frequent menstruation with regular cycle: Secondary | ICD-10-CM | POA: Diagnosis present

## 2019-04-24 MED ORDER — NORETHINDRONE 0.35 MG PO TABS: 1.0000 | ORAL_TABLET | Freq: Every day | ORAL | 11 refills | Status: DC

## 2019-04-24 MED ORDER — VITAMIN D (ERGOCALCIFEROL) 1.25 MG (50000 UNIT) PO CAPS: 50000.0000 [IU] | ORAL_CAPSULE | ORAL | 0 refills | Status: DC

## 2019-04-24 NOTE — Progress Notes (Signed)
Ms Mungin presents in follow up for her menorrhagia. She has been seeing Dr Vergie Living. Last seen 11/20. Has had a complete work up. Significant for elevated Factor 8. Is followed by Heme/Onc.  Last visit was started on Lysteda. However not  covered by insurance.  PE AF VSS Lungs clear Heart RRR Abd soft + BS  A/P Menorrhagia        Elevated Factor 8  TX options reviewed. Declines Depo Provera. IUD discussed by as she is nulligravid, uterus @ 5 cm on U/S. Not a Mirena candidate. Not certain if Cheri Fowler is covered. Discussed progesterone only OCP's. She is amendable to trying. Rx sent to pharmacy. U/R/B and back up method reviewed with pt. To start with next cycle. F/U with Dr. Vergie Living in 2 months

## 2019-07-01 ENCOUNTER — Ambulatory Visit: Payer: Medicare Other | Admitting: Obstetrics and Gynecology

## 2019-07-07 ENCOUNTER — Other Ambulatory Visit: Payer: Self-pay | Admitting: Physician Assistant

## 2019-07-10 ENCOUNTER — Ambulatory Visit: Payer: Medicare Other | Attending: Internal Medicine

## 2019-07-10 DIAGNOSIS — Z23 Encounter for immunization: Secondary | ICD-10-CM

## 2019-07-10 NOTE — Progress Notes (Signed)
   Covid-19 Vaccination Clinic  Name:  Tricia Scott    MRN: [id number] DOB: 1995-12-28  07/10/2019  Tricia Scott was observed post Covid-19 immunization for 15 minutes without incident. She was provided with Vaccine Information Sheet and instruction to access the V-Safe system.   Tricia Scott was instructed to call 911 with any severe reactions post vaccine: Marland Kitchen Difficulty breathing  . Swelling of face and throat  . A fast heartbeat  . A bad rash all over body  . Dizziness and weakness   Immunizations Administered    Name Date Dose VIS Date Route   Pfizer COVID-19 Vaccine 07/10/2019 11:54 AM 0.3 mL 03/27/2019 Intramuscular   Manufacturer: [employer], [employer]   Lot: [id number]   NDC: [address]-8

## 2019-07-21 ENCOUNTER — Ambulatory Visit: Payer: Medicare Other | Attending: Nurse Practitioner | Admitting: Nurse Practitioner

## 2019-07-21 ENCOUNTER — Other Ambulatory Visit: Payer: Self-pay

## 2019-07-21 ENCOUNTER — Encounter: Payer: Self-pay | Admitting: Nurse Practitioner

## 2019-07-21 DIAGNOSIS — E559 Vitamin D deficiency, unspecified: Secondary | ICD-10-CM

## 2019-07-21 DIAGNOSIS — R7309 Other abnormal glucose: Secondary | ICD-10-CM

## 2019-07-21 DIAGNOSIS — Z8249 Family history of ischemic heart disease and other diseases of the circulatory system: Secondary | ICD-10-CM | POA: Insufficient documentation

## 2019-07-21 DIAGNOSIS — N920 Excessive and frequent menstruation with regular cycle: Secondary | ICD-10-CM

## 2019-07-21 DIAGNOSIS — Z833 Family history of diabetes mellitus: Secondary | ICD-10-CM | POA: Insufficient documentation

## 2019-07-21 DIAGNOSIS — R739 Hyperglycemia, unspecified: Secondary | ICD-10-CM | POA: Insufficient documentation

## 2019-07-21 NOTE — Progress Notes (Signed)
Virtual Visit via Telephone Note Due to national recommendations of social distancing due to [place] 19, telehealth visit is felt to be most appropriate for this patient at this time.  I discussed the limitations, risks, security and privacy concerns of performing an evaluation and management service by telephone and the availability of in person appointments. I also discussed with the patient that there may be a patient responsible charge related to this service. The patient expressed understanding and agreed to proceed.    I connected with Tricia Scott on 07/21/19  at   2:10 PM EDT  EDT by telephone and verified that I am speaking with the correct person using two identifiers.   Consent I discussed the limitations, risks, security and privacy concerns of performing an evaluation and management service by telephone and the availability of in person appointments. I also discussed with the patient that there may be a patient responsible charge related to this service. The patient expressed understanding and agreed to proceed.   Location of Patient: Private Residence    Location of Provider: [place] and [employer] Office    Persons participating in Telemedicine visit: Tricia Rankins FNP-BC [place]    History of Present Illness: Telemedicine visit for: F/U menorrhagia  She was referred to OB/GYN for menorrhagia and she was started on oral contraception 04-24-2019. She was not aware that she had refills of her birth control pills and unfortunately ran out and started experiencing some heavy bleeding this month.  I have instructed her that she has refills until January 2022 and that she can pick up her birth control pills today as well as her vitamin D.  She has no other issues or concerns today.       Past Medical History:  Diagnosis Date  . Abnormal uterine bleeding (AUB)   . Allergy   . Asthma   . Obesity   . PTSD (post-traumatic stress disorder)   .  Schizo-affective psychosis ([place])   . Sprains and strains of ankle and foot 12/15/2011   Right side; occurred on Wednesday 12/12/2011  . Urinary tract infection     Past Surgical History:  Procedure Laterality Date  . WISDOM TOOTH EXTRACTION      Family History  Problem Relation Age of Onset  . Diabetes Mother   . Diabetes type II Mother   . Fibromyalgia Mother   . Hypertension Mother   . Mental illness Father   . Mental illness Sister     Social History   Socioeconomic History  . Marital status: Single    Spouse name: Not on file  . Number of children: Not on file  . Years of education: Not on file  . Highest education level: Not on file  Occupational History  . Not on file  Tobacco Use  . Smoking status: Never Smoker  . Smokeless tobacco: Never Used  Substance and Sexual Activity  . Alcohol use: No  . Drug use: No  . Sexual activity: Never    Birth control/protection: None  Other Topics Concern  . Not on file  Social History Narrative  . Not on file   Social Determinants of Health   Financial Resource Strain:   . Difficulty of Paying Living Expenses:   Food Insecurity:   . Worried About Charity fundraiser in the Last Year:   . Arboriculturist in the Last Year:   Transportation Needs:   . Film/video editor (Medical):   Marland Kitchen  Lack of Transportation (Non-Medical):   Physical Activity:   . Days of Exercise per Week:   . Minutes of Exercise per Session:   Stress:   . Feeling of Stress :   Social Connections:   . Frequency of Communication with Friends and Family:   . Frequency of Social Gatherings with Friends and Family:   . Attends Religious Services:   . Active Member of Clubs or Organizations:   . Attends Banker Meetings:   Marland Kitchen Marital Status:      Observations/Objective: Awake, alert and oriented x 3   Review of Systems  Constitutional: Negative for fever, malaise/fatigue and weight loss.  HENT: Negative.  Negative for nosebleeds.    Eyes: Negative.  Negative for blurred vision, double vision and photophobia.  Respiratory: Negative.  Negative for cough and shortness of breath.   Cardiovascular: Negative.  Negative for chest pain, palpitations and leg swelling.  Gastrointestinal: Negative.  Negative for heartburn, nausea and vomiting.  Musculoskeletal: Negative.  Negative for myalgias.  Neurological: Negative.  Negative for dizziness, focal weakness, seizures and headaches.  Psychiatric/Behavioral: Negative.  Negative for suicidal ideas.    Assessment and Plan: Tricia Scott was seen today for follow-up.  Diagnoses and all orders for this visit:  Menorrhagia with regular cycle Continue oral contraceptives as prescribed She is not currently sexually active  Elevated glucose -     Hemoglobin A1c; Future  Vitamin D deficiency -     VITAMIN D 25 Hydroxy (Vit-D Deficiency, Fractures); Future     Follow Up Instructions Return for Physical ONLY no labs.     I discussed the assessment and treatment plan with the patient. The patient was provided an opportunity to ask questions and all were answered. The patient agreed with the plan and demonstrated an understanding of the instructions.   The patient was advised to call back or seek an in-person evaluation if the symptoms worsen or if the condition fails to improve as anticipated.  I provided 14 minutes of non-face-to-face time during this encounter including median intraservice time, reviewing previous notes, labs, imaging, medications and explaining diagnosis and management.  Claiborne Rigg, FNP-BC

## 2019-07-28 ENCOUNTER — Ambulatory Visit: Payer: Medicare Other | Attending: Nurse Practitioner

## 2019-07-28 ENCOUNTER — Other Ambulatory Visit: Payer: Self-pay

## 2019-07-28 DIAGNOSIS — E559 Vitamin D deficiency, unspecified: Secondary | ICD-10-CM

## 2019-07-28 DIAGNOSIS — R7309 Other abnormal glucose: Secondary | ICD-10-CM

## 2019-07-29 LAB — HEMOGLOBIN A1C
Est. average glucose Bld gHb Est-mCnc: 117 mg/dL
Hgb A1c MFr Bld: 5.7 % — ABNORMAL HIGH (ref 4.8–5.6)

## 2019-07-29 LAB — VITAMIN D 25 HYDROXY (VIT D DEFICIENCY, FRACTURES): Vit D, 25-Hydroxy: 44.3 ng/mL (ref 30.0–100.0)

## 2019-08-03 ENCOUNTER — Telehealth: Payer: Self-pay | Admitting: Nurse Practitioner

## 2019-08-03 NOTE — Telephone Encounter (Signed)
Patient called in and requested for a call back from pcp MA. Patient stated she had something very quick to talk with pcp regarding her menstrual cycles. Please follow up at your earliest coneveneinece.

## 2019-08-04 ENCOUNTER — Ambulatory Visit: Payer: Medicare Other | Attending: Internal Medicine

## 2019-08-04 DIAGNOSIS — Z23 Encounter for immunization: Secondary | ICD-10-CM

## 2019-08-04 NOTE — Progress Notes (Signed)
   Covid-19 Vaccination Clinic  Name:  Tricia Scott    MRN: [id number] DOB: 22-Sep-1995  08/04/2019  [name] was observed post Covid-19 immunization for 15 minutes without incident. She was provided with Vaccine Information Sheet and instruction to access the V-Safe system.   [name] was instructed to call 911 with any severe reactions post vaccine: Marland Kitchen Difficulty breathing  . Swelling of face and throat  . A fast heartbeat  . A bad rash all over body  . Dizziness and weakness   Immunizations Administered    Name Date Dose VIS Date Route   Pfizer COVID-19 Vaccine 08/04/2019 11:59 AM 0.3 mL 06/10/2018 Intramuscular   Manufacturer: [employer], [employer]   Lot: [id number]   NDC: [address]-0

## 2019-08-07 NOTE — Telephone Encounter (Signed)
Attempt to reach patient regarding her concerns. No answer and LVM for call back.

## 2019-08-17 ENCOUNTER — Other Ambulatory Visit: Payer: Self-pay

## 2019-08-17 ENCOUNTER — Ambulatory Visit (INDEPENDENT_AMBULATORY_CARE_PROVIDER_SITE_OTHER): Payer: Medicare Other | Admitting: Obstetrics and Gynecology

## 2019-08-17 ENCOUNTER — Encounter: Payer: Self-pay | Admitting: Obstetrics and Gynecology

## 2019-08-17 VITALS — BP 105/52 | HR 80 | Temp 98.7°F | Wt 239.0 lb

## 2019-08-17 DIAGNOSIS — N920 Excessive and frequent menstruation with regular cycle: Secondary | ICD-10-CM | POA: Diagnosis not present

## 2019-08-17 NOTE — Progress Notes (Signed)
Obstetrics and Gynecology Visit Return Patient Evaluation  Appointment Date: 08/17/2019  Primary Care Provider: Claiborne Rigg  OBGYN Clinic: [facility]  Chief Complaint: follow up AUB  History of Present Illness:  Tricia Scott is a 24 y.o. here with above CC. She is doing well (no bleeding or pain) for the progestin only pills except when she had some AUB last month that was heavy when she missed some days because of a lapse in the Rx.   Review of Systems: as noted in the History of Present Illness.   Patient Active Problem List   Diagnosis Date Noted  . Iron deficiency anemia due to chronic blood loss 01/06/2019  . Schizoaffective disorder (HCC) 11/30/2018  . 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 Anemia 11/12/2018  . Menorrhagia 11/12/2018  . BMI 40.0-44.9, adult (HCC) 11/12/2018  . 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 Generalized anxiety disorder 04/22/2011  . Oppositional defiant disorder 04/22/2011   Medications:  [name] had no medications administered during this visit. Current Outpatient Medications  Medication Sig Dispense Refill  . acetaminophen (TYLENOL) 500 MG tablet Take 1 tablet (500 mg total) by mouth every 6 (six) hours as needed. 30 tablet 0  . albuterol (ACCUNEB) 0.63 MG/3ML nebulizer solution Take 1 ampule by nebulization every 6 (six) hours as needed for wheezing or shortness of breath.     . cetirizine (ZYRTEC) 10 MG tablet Take 1 tablet (10 mg total) by mouth at bedtime. 90 tablet 2  . Cyanocobalamin (VITAMIN B-12) 1000 MCG SUBL Place under the tongue.    . fluticasone (FLONASE) 50 MCG/ACT nasal spray Place 2 sprays into both nostrils daily. 16 g 6  . ibuprofen (ADVIL) 600 MG tablet Take 1 tablet (600 mg total) by mouth every 6 (six) hours as needed for headache or cramping. 60 tablet 0  . meclizine (ANTIVERT) 25 MG tablet Take 1 tablet (25 mg  total) by mouth 3 (three) times daily as needed for dizziness. (Patient not taking: Reported on 04/22/2019) 30 tablet 0  . norethindrone (MICRONOR) 0.35 MG tablet Take 1 tablet (0.35 mg total) by mouth daily. 1 Package 11  . Pseudoephedrine-Acetaminophen (SUDAFED SINUS PO) Take by mouth.    Marland Kitchen VIIBRYD 20 MG TABS Take 1 tablet by mouth daily. Once daily at bedtime.    . Vitamin D, Ergocalciferol, (DRISDOL) 1.25 MG (50000 UNIT) CAPS capsule TAKE 1 CAPSULE BY MOUTH EVERY 7 DAYS 12 capsule 1   No current facility-administered medications for this visit.   FHx: mom with heavy periods needing a hyst  Allergies: is allergic to aripiprazole; grass extracts [gramineae pollens]; peanut (diagnostic); risperidone; soy allergy; and seroquel [quetiapine fumerate].  Physical Exam:  BP (!) 105/52   Pulse 80   Temp 98.7 F (37.1 C)   Wt 239 lb (108.4 kg)   LMP 07/30/2019   BMI 43.71 kg/m  Body mass index is 43.71 kg/m. General appearance: Well nourished, well developed female in no acute distress.  Neuro/Psych:  Normal mood and affect.      Assessment: pt doing well  Plan: I d/w her that I wouldn't recommend trying any estrogen containing options at this point given potential for increased VTE with her slightly high factor 8, even though hematology defers the choice to GYN. I told her that if progestin only options stop working then would consider estrogen options   RTC: PRN  Cornelia Copa MD Attending Center  for [employer] Fish farm manager)

## 2019-08-18 ENCOUNTER — Encounter: Payer: Self-pay | Admitting: Obstetrics and Gynecology

## 2019-11-02 ENCOUNTER — Ambulatory Visit (HOSPITAL_COMMUNITY): Payer: Self-pay | Admitting: Clinical

## 2019-11-03 ENCOUNTER — Other Ambulatory Visit: Payer: Self-pay | Admitting: Nurse Practitioner

## 2019-11-03 DIAGNOSIS — J3089 Other allergic rhinitis: Secondary | ICD-10-CM

## 2019-11-03 NOTE — Telephone Encounter (Signed)
Requested Prescriptions  Pending Prescriptions Disp Refills   cetirizine (ZYRTEC) 10 MG tablet [Pharmacy Med Name: CETIRIZINE HCL 10 MG TABLET] 90 tablet 2    Sig: TAKE 1 TABLET BY MOUTH EVERYDAY AT BEDTIME     Ear, Nose, and Throat:  Antihistamines Passed - 11/03/2019  7:50 PM      Passed - Valid encounter within last 12 months    Recent Outpatient Visits          3 months ago Menorrhagia with regular cycle   [facility] [place], Shea Stakes, NP   6 months ago Menorrhagia with regular cycle   [facility] [place], [place], [place]   7 months ago Iron deficiency anemia due to chronic blood loss   [facility] [place], [place], [place]   10 months ago Encounter for annual physical exam   [facility] [place], [place] W, NP   11 months ago Abnormal uterine bleeding (AUB)   [facility] [place], Shea Stakes, NP

## 2019-11-18 ENCOUNTER — Other Ambulatory Visit: Payer: Self-pay

## 2019-11-18 ENCOUNTER — Telehealth (INDEPENDENT_AMBULATORY_CARE_PROVIDER_SITE_OTHER): Payer: Medicare Other | Admitting: Psychiatric/Mental Health

## 2019-11-18 DIAGNOSIS — F319 Bipolar disorder, unspecified: Secondary | ICD-10-CM | POA: Insufficient documentation

## 2019-11-18 DIAGNOSIS — F411 Generalized anxiety disorder: Secondary | ICD-10-CM | POA: Diagnosis not present

## 2019-11-18 DIAGNOSIS — F3281 Premenstrual dysphoric disorder: Secondary | ICD-10-CM | POA: Diagnosis not present

## 2019-11-18 DIAGNOSIS — F3132 Bipolar disorder, current episode depressed, moderate: Secondary | ICD-10-CM

## 2019-11-18 DIAGNOSIS — F4310 Post-traumatic stress disorder, unspecified: Secondary | ICD-10-CM | POA: Insufficient documentation

## 2019-11-18 MED ORDER — VIIBRYD 20 MG PO TABS: 1.0000 | ORAL_TABLET | Freq: Every day | ORAL | 3 refills | Status: DC

## 2019-12-07 ENCOUNTER — Encounter (HOSPITAL_COMMUNITY): Payer: Self-pay | Admitting: Psychiatric/Mental Health

## 2019-12-07 NOTE — Progress Notes (Signed)
Psychiatric Initial Adult Assessment  Virtual Visit via Video Note  I connected with Tricia Scott on 8/4/2021by a video enabled telemedicine application and verified that I am speaking with the correct person using two identifiers.   Location: Patient: Home Provider: Home office   I discussed the limitations of evaluation and management by telemedicine and the availability of in person appointments. The patient expressed understanding and agreed to proceed.  I provided 45 minutes of non-face-to-face time during this encounter.  Patient Identification: Tricia Scott MRN:  [id number] Date of Evaluation:  12/07/2019 Referral Source: [facility] Chief Complaint:   Visit Diagnosis:    ICD-10-CM   1. PMDD (premenstrual dysphoric disorder)  F32.81 VIIBRYD 20 MG TABS  2. PTSD (post-traumatic stress disorder)  F43.10   3. Bipolar affective disorder, currently depressed, moderate (HCC)  F31.32 VIIBRYD 20 MG TABS  4. Generalized anxiety disorder  F41.1 VIIBRYD 20 MG TABS   Past Psychiatric History:PMDD  Previous Psychotropic Medications: No   Substance Abuse History in the last 12 months:  No.  Consequences of Substance Abuse: NA  Past Medical History:  Past Medical History:  Diagnosis Date  . Abnormal uterine bleeding (AUB)   . Allergy   . Asthma   . Obesity   . PTSD (post-traumatic stress disorder)   . Schizo-affective psychosis (HCC)   . Sprains and strains of ankle and foot 12/15/2011   Right side; occurred on Wednesday 12/12/2011  . Urinary tract infection     Past Surgical History:  Procedure Laterality Date  . WISDOM TOOTH EXTRACTION      Family Psychiatric History:   Family History:  Family History  Problem Relation Age of Onset  . Diabetes Mother   . Diabetes type II Mother   . Fibromyalgia Mother   . Hypertension Mother   . Clotting disorder Mother        pt states mom is prone to blood clots. unsure if has actual d/o  . Mental illness Father   . Mental illness Sister      Social History:   Social History   Socioeconomic History  . Marital status: Single    Spouse name: Not on file  . Number of children: Not on file  . Years of education: Not on file  . Highest education level: Not on file  Occupational History  . Not on file  Tobacco Use  . Smoking status: Never Smoker  . Smokeless tobacco: Never Used  Vaping Use  . Vaping Use: Never used  Substance and Sexual Activity  . Alcohol use: No  . Drug use: No  . Sexual activity: Never    Birth control/protection: None  Other Topics Concern  . Not on file  Social History Narrative  . Not on file   Social Determinants of Health   Financial Resource Strain:   . Difficulty of Paying Living Expenses: Not on file  Food Insecurity: No Food Insecurity  . Worried About Programme researcher, broadcasting/film/video in the Last Year: Never true  . Ran Out of Food in the Last Year: Never true  Transportation Needs: No Transportation Needs  . Lack of Transportation (Medical): No  . Lack of Transportation (Non-Medical): No  Physical Activity:   . Days of Exercise per Week: Not on file  . Minutes of Exercise per Session: Not on file  Stress:   . Feeling of Stress : Not on file  Social Connections:   . Frequency of Communication with Friends and Family: Not on file  .  Frequency of Social Gatherings with Friends and Family: Not on file  . Attends Religious Services: Not on file  . Active Member of Clubs or Organizations: Not on file  . Attends Banker Meetings: Not on file  . Marital Status: Not on file    Additional Social History:   Allergies:   Allergies  Allergen Reactions  . Aripiprazole Other (See Comments)    Weight gain  . Grass Extracts [Gramineae Pollens]   . Peanut (Diagnostic)   . Risperidone Other (See Comments)    lactation  . Soy Allergy   . Seroquel [Quetiapine Fumerate] Rash    Metabolic Disorder Labs: Lab Results  Component Value Date   HGBA1C 5.7 (H) 07/28/2019   MPG 128 (H)  04/22/2011   MPG 129 09/25/2007   No results found for: PROLACTIN Lab Results  Component Value Date   CHOL 179 (H) 04/22/2011   TRIG 36 04/22/2011   HDL 58 04/22/2011   CHOLHDL 3.1 04/22/2011   VLDL 7 04/22/2011   LDLCALC 114 (H) 04/22/2011   LDLCALC  09/25/2007    76        Total Cholesterol/HDL:CHD Risk Coronary Heart Disease Risk Table                     Men   Women  1/2 Average Risk   3.4   3.3   Lab Results  Component Value Date   TSH 0.934 04/22/2019    Therapeutic Level Labs: No results found for: LITHIUM No results found for: CBMZ No results found for: VALPROATE  Current Medications: Current Outpatient Medications  Medication Sig Dispense Refill  . acetaminophen (TYLENOL) 500 MG tablet Take 1 tablet (500 mg total) by mouth every 6 (six) hours as needed. 30 tablet 0  . albuterol (ACCUNEB) 0.63 MG/3ML nebulizer solution Take 1 ampule by nebulization every 6 (six) hours as needed for wheezing or shortness of breath.     . cetirizine (ZYRTEC) 10 MG tablet TAKE 1 TABLET BY MOUTH EVERYDAY AT BEDTIME 90 tablet 2  . Cyanocobalamin (VITAMIN B-12) 1000 MCG SUBL Place under the tongue.    . fluticasone (FLONASE) 50 MCG/ACT nasal spray Place 2 sprays into both nostrils daily. 16 g 6  . ibuprofen (ADVIL) 600 MG tablet Take 1 tablet (600 mg total) by mouth every 6 (six) hours as needed for headache or cramping. 60 tablet 0  . meclizine (ANTIVERT) 25 MG tablet Take 1 tablet (25 mg total) by mouth 3 (three) times daily as needed for dizziness. (Patient not taking: Reported on 04/22/2019) 30 tablet 0  . Mefenamic Acid 250 MG CAPS 500mg  po at start of period symptoms and then 250mg  po q6h for up to 3 days (Patient not taking: Reported on 03/26/2019) 39 capsule 3  . norethindrone (MICRONOR) 0.35 MG tablet Take 1 tablet (0.35 mg total) by mouth daily. 1 Package 11  . Pseudoephedrine-Acetaminophen (SUDAFED SINUS PO) Take by mouth.    VIIBRYD 20 MG TABS Take 1 tablet (20 mg total) by  mouth daily. Once daily at bedtime. 30 tablet 3  . Vitamin D, Ergocalciferol, (DRISDOL) 1.25 MG (50000 UNIT) CAPS capsule TAKE 1 CAPSULE BY MOUTH EVERY 7 DAYS 12 capsule 1   No current facility-administered medications for this visit.    Musculoskeletal: Strength & Muscle Tone: within normal limits Gait & Station: normal Patient leans: N/A  Psychiatric Specialty Exam: Review of Systems  There were no vitals taken for this visit.There is no  height or weight on file to calculate BMI.  General Appearance: Casual  Eye Contact:  Good  Speech:  Clear and Coherent  Volume:  Normal  Mood:  Euthymic  Affect:  Congruent  Thought Process:  Coherent and Descriptions of Associations: Intact  Orientation:  Full (Time, Place, and Person)  Thought Content:  Logical  Suicidal Thoughts:  No  Homicidal Thoughts:  No  Memory:  NA  Judgement:  Good  Insight:  Good  Psychomotor Activity:  Normal  Concentration:  Concentration: Good  Recall:  Good  Fund of Knowledge:Good  Language: Good  Akathisia:  NA  Handed:  Right  AIMS (if indicated):  not done  Assets:  Desire for Improvement Leisure Time  ADL's:  Intact  Cognition: WNL  Sleep:  Good   Screenings: GAD-7     Office Visit from 08/17/2019 in [facility] Office Visit from 07/21/2019 in [facility] Office Visit from 12/26/2018 in [facility] Office Visit from 09/24/2018 in [facility]  Total GAD-7 Score 0 2 21 4     PHQ2-9     Office Visit from 08/17/2019 in [facility] for 10/17/2019 [facility] Office Visit from 07/21/2019 in [facility] Office Visit from 03/26/2019 in [facility] Office Visit from 12/26/2018 in [facility] Office Visit from 09/24/2018 in [facility]   PHQ-2 Total Score 0 0 0 0 0  PHQ-9 Total Score 1 0 -- 1 2      Assessment and Plan:    ICD-10-CM   1. PMDD (premenstrual dysphoric disorder)  F32.81 VIIBRYD 20 MG TABS  2. PTSD (post-traumatic stress disorder)  F43.10   3. Bipolar affective disorder, currently depressed, moderate (HCC)  F31.32 VIIBRYD 20 MG TABS  4. Generalized anxiety disorder  F41.1 VIIBRYD 20 MG TABS     [facility], NP 8/23/20214:27 AM

## 2019-12-22 ENCOUNTER — Telehealth: Payer: Self-pay | Admitting: *Deleted

## 2019-12-22 NOTE — Telephone Encounter (Signed)
CMA attempt to reach patient to inform on PCP advising. PCP recommend patient to follow up w/ her gynecologist regarding her menstrual cycle bleeding. PCP is not able to manage her menstrual cycle and prefer gynecologist b/c they specialize in heavy menstrual bleeding.  No answer and LVM.

## 2019-12-22 NOTE — Telephone Encounter (Signed)
Patient walked in stating she is having heavy bleeding and she does not want to go to her obgyn she would rather talk to Dr Meredeth Ide about this. Patient states her cycles are so heavy she bleeds through her clothes. Patient also states she is taking her birth control as well. I made patient aware Alesia Banda will talk to DR Meredeth Ide and will give her a call.

## 2020-01-22 ENCOUNTER — Ambulatory Visit (INDEPENDENT_AMBULATORY_CARE_PROVIDER_SITE_OTHER): Payer: Medicare Other | Admitting: Obstetrics and Gynecology

## 2020-01-22 ENCOUNTER — Encounter: Payer: Self-pay | Admitting: Obstetrics and Gynecology

## 2020-01-22 ENCOUNTER — Other Ambulatory Visit: Payer: Self-pay

## 2020-01-22 VITALS — BP 127/82 | HR 103 | Wt 238.0 lb

## 2020-01-22 DIAGNOSIS — Z3202 Encounter for pregnancy test, result negative: Secondary | ICD-10-CM | POA: Diagnosis not present

## 2020-01-22 DIAGNOSIS — N939 Abnormal uterine and vaginal bleeding, unspecified: Secondary | ICD-10-CM | POA: Diagnosis not present

## 2020-01-22 LAB — POCT PREGNANCY, URINE: Preg Test, Ur: NEGATIVE

## 2020-01-22 MED ORDER — SLYND 4 MG PO TABS: 1.0000 | ORAL_TABLET | Freq: Every day | ORAL | 3 refills | Status: DC

## 2020-01-22 NOTE — Progress Notes (Signed)
heavy bleeding through her diapers and pads going up shirts ans stainign everything  7 days long and heavy all the time

## 2020-01-22 NOTE — Progress Notes (Signed)
Obstetrics and Gynecology Visit Return Patient Evaluation  Appointment Date: 01/22/2020  Primary Care Provider: Claiborne Rigg  OBGYN Clinic: [facility]  Chief Complaint: AUB  History of Present Illness:  Tricia Scott is a 24 y.o. with above CC. Patient has been on Camila POPs to help with her AUB. She states that the AUB has been happening all summer and that she had two periods in August. Currently only having some slight blood-mucus d/c. No s/s of anemia  Review of Systems:  as noted in the History of Present Illness.   Patient Active Problem List   Diagnosis Date Noted  . PTSD (post-traumatic stress disorder) 11/18/2019  . Bipolar disorder, unspecified (HCC) 11/18/2019  . PMDD (premenstrual dysphoric disorder) 11/18/2019  . Iron deficiency anemia due to chronic blood loss 01/06/2019  . Schizoaffective disorder (HCC) 11/30/2018  . 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 Anemia 11/12/2018  . Menorrhagia 11/12/2018  . BMI 40.0-44.9, adult (HCC) 11/12/2018  . 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 Generalized anxiety disorder 04/22/2011  . Oppositional defiant disorder 04/22/2011   Medications:  [name] had no medications administered during this visit. Current Outpatient Medications  Medication Sig Dispense Refill  . acetaminophen (TYLENOL) 500 MG tablet Take 1 tablet (500 mg total) by mouth every 6 (six) hours as needed. 30 tablet 0  . albuterol (ACCUNEB) 0.63 MG/3ML nebulizer solution Take 1 ampule by nebulization every 6 (six) hours as needed for wheezing or shortness of breath.     . cetirizine (ZYRTEC) 10 MG tablet TAKE 1 TABLET BY MOUTH EVERYDAY AT BEDTIME 90 tablet 2  . Cyanocobalamin (VITAMIN B-12) 1000 MCG SUBL Place under the tongue.    . fluticasone (FLONASE) 50 MCG/ACT nasal spray Place 2 sprays into both nostrils daily. 16 g 6  . ibuprofen (ADVIL) 600 MG  tablet Take 1 tablet (600 mg total) by mouth every 6 (six) hours as needed for headache or cramping. 60 tablet 0  . norethindrone (MICRONOR) 0.35 MG tablet Take 1 tablet (0.35 mg total) by mouth daily. 1 Package 11  . Pseudoephedrine-Acetaminophen (SUDAFED SINUS PO) Take by mouth.    Marland Kitchen VIIBRYD 20 MG TABS Take 1 tablet (20 mg total) by mouth daily. Once daily at bedtime. 30 tablet 3  . Vitamin D, Ergocalciferol, (DRISDOL) 1.25 MG (50000 UNIT) CAPS capsule TAKE 1 CAPSULE BY MOUTH EVERY 7 DAYS 12 capsule 1  . meclizine (ANTIVERT) 25 MG tablet Take 1 tablet (25 mg total) by mouth 3 (three) times daily as needed for dizziness. (Patient not taking: Reported on 04/22/2019) 30 tablet 0  . Mefenamic Acid 250 MG CAPS 500mg  po at start of period symptoms and then 250mg  po q6h for up to 3 days (Patient not taking: Reported on 03/26/2019) 39 capsule 3   No current facility-administered medications for this visit.    Allergies: is allergic to aripiprazole, grass extracts [gramineae pollens], peanut (diagnostic), risperidone, soy allergy, and seroquel [quetiapine fumerate].  Physical Exam:  BP 127/82   Pulse (!) 103   Wt 238 lb (108 kg)   BMI 43.53 kg/m  Body mass index is 43.53 kg/m. General appearance: Well nourished, well developed female in no acute distress.  Neuro/Psych:  Normal mood and affect.     Assessment: Tricia Scott stable  Plan: UPT negative. Given her h/o elevated factor 8, I recommend keeping on progestin only methods. I d/w her re: Slynd and she is amenable  to this and will use this continuously.   RTC: 86m  Cornelia Copa MD Attending Center for [facility])

## 2020-02-10 ENCOUNTER — Other Ambulatory Visit: Payer: Self-pay | Admitting: *Deleted

## 2020-02-10 DIAGNOSIS — N939 Abnormal uterine and vaginal bleeding, unspecified: Secondary | ICD-10-CM

## 2020-02-10 MED ORDER — SLYND 4 MG PO TABS: 1.0000 | ORAL_TABLET | Freq: Every day | ORAL | 3 refills | Status: DC

## 2020-02-18 ENCOUNTER — Telehealth (HOSPITAL_COMMUNITY): Payer: Medicare Other

## 2020-02-18 ENCOUNTER — Other Ambulatory Visit: Payer: Self-pay

## 2020-02-18 ENCOUNTER — Telehealth (HOSPITAL_COMMUNITY): Payer: Self-pay | Admitting: Psychiatry

## 2020-02-18 DIAGNOSIS — F411 Generalized anxiety disorder: Secondary | ICD-10-CM

## 2020-02-18 DIAGNOSIS — F3132 Bipolar disorder, current episode depressed, moderate: Secondary | ICD-10-CM

## 2020-02-18 DIAGNOSIS — F3281 Premenstrual dysphoric disorder: Secondary | ICD-10-CM

## 2020-02-18 MED ORDER — VIIBRYD 20 MG PO TABS: 1.0000 | ORAL_TABLET | Freq: Every day | ORAL | 0 refills | Status: DC

## 2020-02-18 NOTE — Telephone Encounter (Signed)
Rx sent for 30 days.

## 2020-02-23 ENCOUNTER — Other Ambulatory Visit: Payer: Self-pay | Admitting: Obstetrics and Gynecology

## 2020-02-23 MED ORDER — NORETHINDRONE ACETATE 5 MG PO TABS: 10.0000 mg | ORAL_TABLET | Freq: Two times a day (BID) | ORAL | 3 refills | Status: AC

## 2020-02-25 ENCOUNTER — Emergency Department (HOSPITAL_COMMUNITY)
Admission: EM | Admit: 2020-02-25 | Discharge: 2020-02-26 | Disposition: A | Discharge status: Home / Self Care | Payer: Medicare Other | Attending: Emergency Medicine | Admitting: Emergency Medicine

## 2020-02-25 ENCOUNTER — Encounter (HOSPITAL_COMMUNITY): Payer: Self-pay

## 2020-02-25 ENCOUNTER — Emergency Department (HOSPITAL_COMMUNITY): Payer: Medicare Other

## 2020-02-25 ENCOUNTER — Other Ambulatory Visit: Payer: Self-pay

## 2020-02-25 DIAGNOSIS — Z91010 Allergy to peanuts: Secondary | ICD-10-CM | POA: Diagnosis not present

## 2020-02-25 DIAGNOSIS — N939 Abnormal uterine and vaginal bleeding, unspecified: Secondary | ICD-10-CM | POA: Insufficient documentation

## 2020-02-25 DIAGNOSIS — G43909 Migraine, unspecified, not intractable, without status migrainosus: Secondary | ICD-10-CM | POA: Insufficient documentation

## 2020-02-25 DIAGNOSIS — G43809 Other migraine, not intractable, without status migrainosus: Secondary | ICD-10-CM

## 2020-02-25 DIAGNOSIS — R519 Headache, unspecified: Secondary | ICD-10-CM | POA: Diagnosis present

## 2020-02-25 LAB — I-STAT BETA HCG BLOOD, ED (MC, WL, AP ONLY): I-stat hCG, quantitative: <5 m[IU]/mL (ref <5)

## 2020-02-25 LAB — CBC
HCT: 43.6 % (ref 36.0–46.0)
Hemoglobin: 13.8 g/dL (ref 12.0–15.0)
MCH: 27.9 pg (ref 26.0–34.0)
MCHC: 31.7 g/dL (ref 30.0–36.0)
MCV: 88.3 fL (ref 80.0–100.0)
Platelets: 338 10*3/uL (ref 150–400)
RBC: 4.94 MIL/uL (ref 3.87–5.11)
RDW: 13.4 % (ref 11.5–15.5)
WBC: 11.3 10*3/uL — ABNORMAL HIGH (ref 4.0–10.5)
nRBC: 0 % (ref 0.0–0.2)

## 2020-02-25 LAB — BASIC METABOLIC PANEL
Anion gap: 10 (ref 5–15)
BUN: 10 mg/dL (ref 6–20)
CO2: 23 mmol/L (ref 22–32)
Calcium: 9 mg/dL (ref 8.9–10.3)
Chloride: 104 mmol/L (ref 98–111)
Creatinine, Ser: 0.91 mg/dL (ref 0.44–1.00)
GFR, Estimated: >60 mL/min (ref >60)
Glucose, Bld: 113 mg/dL — ABNORMAL HIGH (ref 70–99)
Potassium: 3.7 mmol/L (ref 3.5–5.1)
Sodium: 137 mmol/L (ref 135–145)

## 2020-02-25 LAB — TROPONIN I (HIGH SENSITIVITY): Troponin I (High Sensitivity): <2 ng/L (ref <18)

## 2020-02-25 MED ORDER — METOCLOPRAMIDE HCL 5 MG/ML IJ SOLN
10.0000 mg | Freq: Once | INTRAMUSCULAR | Status: AC
  Administered 2020-02-25: 10 mg via INTRAVENOUS
  Filled 2020-02-25: qty 2

## 2020-02-25 MED ORDER — ACETAMINOPHEN 325 MG PO TABS
650.0000 mg | ORAL_TABLET | Freq: Once | ORAL | Status: AC
  Administered 2020-02-25: 650 mg via ORAL
  Filled 2020-02-25: qty 2

## 2020-02-25 MED ORDER — DIPHENHYDRAMINE HCL 50 MG/ML IJ SOLN
25.0000 mg | Freq: Once | INTRAMUSCULAR | Status: AC
  Administered 2020-02-25: 25 mg via INTRAVENOUS
  Filled 2020-02-25: qty 1

## 2020-02-25 MED ORDER — LACTATED RINGERS IV BOLUS
1000.0000 mL | Freq: Once | INTRAVENOUS | Status: AC
  Administered 2020-02-25: 1000 mL via INTRAVENOUS

## 2020-02-25 NOTE — ED Triage Notes (Signed)
Pt arrives to ED w/ c/o 10/10 chest pain, sob, heavy menstrual period, headache, and generalized pain since Monday. Resp e/u. Pt appears in NAD.

## 2020-02-25 NOTE — ED Provider Notes (Signed)
[facility] EMERGENCY DEPARTMENT Provider Note   CSN: [id number] Arrival date & time: 02/25/20  2019     History Chief Complaint  Patient presents with  . Chest Pain  . Vaginal Bleeding    Tricia Scott is a 24 y.o. female.  Patient reports history of heavy periods associated with migraines. She is currently following with her OBGYN for AUB. She reports that this period that started Monday has been associated with especially heavy bleeding and severe migraine. OBGYN prescribed Aygestin today for heavy bleeding.   The history is provided by the patient.  Headache Pain location:  Generalized Quality: throbbing. Severity currently:  10/10 Severity at highest:  10/10 Onset quality:  Gradual Duration:  3 days Timing:  Constant Progression:  Unchanged Chronicity:  Recurrent Similar to prior headaches: yes   Relieved by:  Nothing Worsened by:  Nothing Ineffective treatments:  Acetaminophen Associated symptoms: abdominal pain, back pain, fatigue, myalgias, nausea and neck pain   Associated symptoms: no cough, no ear pain, no eye pain, no fever, no seizures, no sore throat and no vomiting        Past Medical History:  Diagnosis Date  . Abnormal uterine bleeding (AUB)   . Allergy   . Asthma   . Obesity   . PTSD (post-traumatic stress disorder)   . Schizo-affective psychosis (HCC)   . Sprains and strains of ankle and foot 12/15/2011   Right side; occurred on Wednesday 12/12/2011  . Urinary tract infection     Patient Active Problem List   Diagnosis Date Noted  . PTSD (post-traumatic stress disorder) 11/18/2019  . Bipolar disorder, unspecified (HCC) 11/18/2019  . PMDD (premenstrual dysphoric disorder) 11/18/2019  . Iron deficiency anemia due to chronic blood loss 01/06/2019  . Schizoaffective disorder (HCC) 11/30/2018  . 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 Anemia 11/12/2018  . Menorrhagia 11/12/2018  . BMI 40.0-44.9, adult (HCC) 11/12/2018    . 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 Generalized anxiety disorder 04/22/2011  . Oppositional defiant disorder 04/22/2011    Past Surgical History:  Procedure Laterality Date  . WISDOM TOOTH EXTRACTION       OB History    Gravida  0   Para  0   Term  0   Preterm  0   AB  0   Living  0     SAB  0   TAB  0   Ectopic  0   Multiple  0   Live Births  0           Family History  Problem Relation Age of Onset  . Diabetes Mother   . Diabetes type II Mother   . Fibromyalgia Mother   . Hypertension Mother   . Clotting disorder Mother        pt states mom is prone to blood clots. unsure if has actual d/o  . Mental illness Father   . Mental illness Sister     Social History   Tobacco Use  . Smoking status: Never Smoker  . Smokeless tobacco: Never Used  Vaping Use  . Vaping Use: Never used  Substance Use Topics  . Alcohol use: No  . Drug use: No    Home Medications Prior to Admission medications   Medication Sig Start Date End Date Taking? Authorizing Provider  acetaminophen (TYLENOL) 500 MG tablet Take 1 tablet (500 mg total) by mouth every 6 (six) hours as needed.  01/12/18   Law, Waylan Boga, PA-C  albuterol (ACCUNEB) 0.63 MG/3ML nebulizer solution Take 1 ampule by nebulization every 6 (six) hours as needed for wheezing or shortness of breath.     [provider]  cetirizine (ZYRTEC) 10 MG tablet TAKE 1 TABLET BY MOUTH EVERYDAY AT BEDTIME 11/03/19   Claiborne Rigg, NP  Cyanocobalamin (VITAMIN B-12) 1000 MCG SUBL Place under the tongue.    [provider]  Drospirenone (SLYND) 4 MG TABS Take 1 tablet by mouth daily. Take the placebo pills once every three months. 02/10/20   Carbon Bing, MD  fluticasone (FLONASE) 50 MCG/ACT nasal spray Place 2 sprays into both nostrils daily. 03/26/19   Anders Simmonds, PA-C  ibuprofen (ADVIL) 600 MG tablet Take 1 tablet (600 mg total) by  mouth every 6 (six) hours as needed for headache or cramping. 09/24/18   Claiborne Rigg, NP  meclizine (ANTIVERT) 25 MG tablet Take 1 tablet (25 mg total) by mouth 3 (three) times daily as needed for dizziness. Patient not taking: Reported on 04/22/2019 03/15/19   Lurline Idol, FNP  Mefenamic Acid 250 MG CAPS 500mg  po at start of period symptoms and then 250mg  po q6h for up to 3 days Patient not taking: Reported on 03/26/2019 03/13/19   14/01/2019, MD  norethindrone (AYGESTIN) 5 MG tablet Take 2 tablets (10 mg total) by mouth in the morning and at bedtime. 02/23/20   Boyce Bing, MD  Pseudoephedrine-Acetaminophen (SUDAFED SINUS PO) Take by mouth.    [provider]  VIIBRYD 20 MG TABS Take 1 tablet (20 mg total) by mouth daily. Once daily at bedtime. 02/18/20   [place] Bing, MD  Vitamin D, Ergocalciferol, (DRISDOL) 1.25 MG (50000 UNIT) CAPS capsule TAKE 1 CAPSULE BY MOUTH EVERY 7 DAYS 07/07/19   Zena Amos, MD  cromolyn (NASALCROM) 5.2 MG/ACT nasal spray Place 1 spray into both nostrils 4 (four) times daily. Patient not taking: Reported on 03/11/2019 09/24/18 03/15/19  11/24/18, NP  iron polysaccharides (NIFEREX) 150 MG capsule Take 1 capsule (150 mg total) by mouth daily. Patient not taking: Reported on 11/24/2018 11/12/18 03/15/19  11/14/18, MD    Allergies    Aripiprazole, Grass extracts [gramineae pollens], Peanut (diagnostic), Risperidone, Soy allergy, and Seroquel [quetiapine fumerate]  Review of Systems   Review of Systems  Constitutional: Positive for appetite change and fatigue. Negative for chills and fever.  HENT: Negative for ear pain and sore throat.   Eyes: Negative for pain and visual disturbance.  Respiratory: Positive for shortness of breath. Negative for cough.   Cardiovascular: Positive for chest pain. Negative for palpitations.  Gastrointestinal: Positive for abdominal pain and nausea. Negative for vomiting.  Genitourinary:  Negative for dysuria and hematuria.  Musculoskeletal: Positive for back pain, myalgias and neck pain. Negative for arthralgias.  Skin: Negative for color change and rash.  Neurological: Positive for headaches. Negative for seizures and syncope.  All other systems reviewed and are negative.   Physical Exam Updated Vital Signs BP 128/87   Pulse 80   Temp 98.2 F (36.8 C)   Resp 19   SpO2 98%   Physical Exam Vitals and nursing note reviewed.  Constitutional:      Appearance: She is obese. She is not ill-appearing, toxic-appearing or diaphoretic.  HENT:     Head: Normocephalic and atraumatic.  Eyes:     Conjunctiva/sclera: Conjunctivae normal.  Cardiovascular:     Rate and Rhythm: Normal rate and regular rhythm.  Heart sounds: No murmur heard.  No gallop.   Pulmonary:     Effort: Pulmonary effort is normal. No tachypnea or respiratory distress.     Breath sounds: Normal breath sounds. No wheezing or rhonchi.  Abdominal:     Palpations: Abdomen is soft.     Tenderness: There is no abdominal tenderness.  Musculoskeletal:     Cervical back: Neck supple.     Right lower leg: No tenderness. No edema.     Left lower leg: No tenderness. No edema.  Skin:    General: Skin is warm and dry.  Neurological:     Mental Status: She is alert.     Comments: Mental status: alert and oriented to person, place, time, situation. Speech: Speech is clear and language is not aphasic Fund of knowledge: Intact  Cranial Nerves:  II: Intact to confrontation bilaterally III, IV, VI: EOMI, no nystagmus V: face sensation intact, good masseter strength VII: no facial droop or weakness VIII: gross hearing intact bilaterally IX/XI: palate elevates symmetrically XII: tongue protrudes symmetrically, no deviation  Strength: 5/5 and symmetric in BUE and BLE. No pronation or drift. Tone: normal tone, no tremors Coordination: Intact finger to nose and heel to shin. Sensation: intact to light touch  in all extremities.  Romberg negative.  Gait: Routine gait stable without assistance      ED Results / Procedures / Treatments   Labs (all labs ordered are listed, but only abnormal results are displayed) Labs Reviewed  BASIC METABOLIC PANEL - Abnormal; Notable for the following components:      Result Value   Glucose, Bld 113 (*)    All other components within normal limits  CBC - Abnormal; Notable for the following components:   WBC 11.3 (*)    All other components within normal limits  I-STAT BETA HCG BLOOD, ED ([facility], [facility], [facility] ONLY)  TROPONIN I (HIGH SENSITIVITY)    EKG EKG Interpretation  Date/Time:  Thursday February 25 2020 20:38:34 EST Ventricular Rate:  92 PR Interval:  136 QRS Duration: 80 QT Interval:  344 QTC Calculation: 425 R Axis:   58 Text Interpretation: Normal sinus rhythm Normal ECG Confirmed by Tilden Fossa [id number]) on 02/25/2020 9:31:12 PM   Radiology DG Chest 2 View  Result Date: 02/25/2020 CLINICAL DATA:  Chest pain EXAM: CHEST - 2 VIEW COMPARISON:  None. FINDINGS: The heart size and mediastinal contours are within normal limits. Both lungs are clear. The visualized skeletal structures are unremarkable. IMPRESSION: No active cardiopulmonary disease. Electronically Signed   By: Deatra Robinson M.D.   On: 02/25/2020 20:51    Procedures Procedures (including critical care time)  Medications Ordered in ED Medications  lactated ringers bolus 1,000 mL (0 mLs Intravenous Stopped 02/26/20 0026)  diphenhydrAMINE (BENADRYL) injection 25 mg (25 mg Intravenous Given 02/25/20 2233)  metoCLOPramide (REGLAN) injection 10 mg (10 mg Intravenous Given 02/25/20 2233)  acetaminophen (TYLENOL) tablet 650 mg (650 mg Oral Given 02/25/20 2233)    ED Course  I have reviewed the triage vital signs and the nursing notes.  Pertinent labs & imaging results that were available during my care of the patient were reviewed by me and considered in my medical decision making  (see chart for details).    MDM Rules/Calculators/A&P                          The patient is a 24yo female, PMH AUB, migraines, who presents to the ED  for multiple symptoms, diffuse pain, heavy bleeding.  On my initial evaluation, the patient is  hemodynamically stable, afebrile, nontoxic-appearing. Physical exam unremarkable, normal neuro exam.  Differentials considered include migraine, anemia, electrolyte abnormality. Likely one of her usual migraines in setting of heavy period due to AUB.   Patient provided IV reglan, IV benadryl, IV fluid bolus, PO tylenol for migraine. Labs obtained prior to my evaluation unremarkable. CXR unremarkable. EKG with normal sinus rhythm, no ischemic changes.   On reevaluation, patient with improvement in symptoms. Advised patient of concern for migraine and AUB. Advised treatment of symptoms with continuing aygestin, tylenol every 6hrs as needed for headache. Recommended follow-up with PCP and OBGYN in the next couple days. Strict return precautions provided. Patient discharged in stable condition.   The care of this patient was overseen by Dr. Madilyn Hookees, who agreed with evaluation and plan of care.   Final Clinical Impression(s) / ED Diagnoses Final diagnoses:  Other migraine without status migrainosus, not intractable  Abnormal uterine bleeding    Rx / DC Orders ED Discharge Orders    None       Loletha CarrowEaston, Rico Massar, MD 02/26/20 [id number]    Tilden Fossaees, Elizabeth, MD 02/27/20 1347

## 2020-02-25 NOTE — Discharge Instructions (Signed)
Continue medications as prescribed by your OB/GYN.  Please follow-up with your OB/GYN.  Please return to the ED for worsening symptoms, syncope, other new symptoms that are concerning to you.

## 2020-02-25 NOTE — ED Notes (Signed)
Pt to ED today for 10/10 headache, increased vaginal bleeding x 3days while on menstrual cycle, and generalized body aches

## 2020-02-26 NOTE — ED Notes (Signed)
Dc instructions reviewed with patient and patient verbalized understanding. vss and nad.

## 2020-02-29 ENCOUNTER — Telehealth: Payer: Self-pay

## 2020-02-29 NOTE — Telephone Encounter (Signed)
Notified pt that her insurance will not cover Sylnd.  Pt reports that the Aygestin is effective.  She is not having no bleeding and pain.  Pt states that she has scheduled an appt with Dr. Crissie Reese tomorrow.  I encouraged pt that she should schedule with [facility] as he knows her and they have already have a plan of care in place.  I advised pt that because it sounds like the medication is working that an urgent appt is not necessary.  Pt agreed.  Pt was able to be scheduled with Dr. Vergie Living on 03/17/20 @ 1555 to discuss management.    Addison Naegeli, RN  02/29/20

## 2020-03-01 ENCOUNTER — Other Ambulatory Visit: Payer: Self-pay

## 2020-03-01 ENCOUNTER — Ambulatory Visit: Payer: Medicare Other | Admitting: Family Medicine

## 2020-03-01 ENCOUNTER — Telehealth (INDEPENDENT_AMBULATORY_CARE_PROVIDER_SITE_OTHER): Payer: Medicare Other | Admitting: Physician Assistant

## 2020-03-01 DIAGNOSIS — F411 Generalized anxiety disorder: Secondary | ICD-10-CM

## 2020-03-01 DIAGNOSIS — F3132 Bipolar disorder, current episode depressed, moderate: Secondary | ICD-10-CM | POA: Diagnosis not present

## 2020-03-01 DIAGNOSIS — F4310 Post-traumatic stress disorder, unspecified: Secondary | ICD-10-CM

## 2020-03-01 DIAGNOSIS — F3281 Premenstrual dysphoric disorder: Secondary | ICD-10-CM

## 2020-03-01 MED ORDER — VIIBRYD 20 MG PO TABS: 1.0000 | ORAL_TABLET | Freq: Every day | ORAL | 0 refills | Status: DC

## 2020-03-01 NOTE — Progress Notes (Signed)
BH MD/PA/NP OP Progress Note  Virtual Visit via Video Note  I connected with Tricia Scott on 03/01/20 at  8:30 AM EST by a video enabled telemedicine application and verified that I am speaking with the correct person using two identifiers.  Location: Patient: Car Provider: Offiice   I discussed the limitations of evaluation and management by telemedicine and the availability of in person appointments. The patient expressed understanding and agreed to proceed.  Follow Up Instructions:  I discussed the assessment and treatment plan with the patient. The patient was provided an opportunity to ask questions and all were answered. The patient agreed with the plan and demonstrated an understanding of the instructions.   The patient was advised to call back or seek an in-person evaluation if the symptoms worsen or if the condition fails to improve as anticipated.  I provided 18 minutes of non-face-to-face time during this encounter.   Meta Hatchet, PA   03/01/2020 8:29 AM ARYANA Scott  MRN:  [id number]  Chief Complaint: Follow-up and Medication management  HPI:  Tricia Scott is a 24 year old female with a past psychiatric history significant for bipolar affective disorder, premenstrual dysphoric disorder, generalized anxiety disorder, and PTSD who presents to [facility] via virtual video visit for follow up and medication management. Patient states she is currently Viibryd 20 mg for management of her mood. Patient reports that the medication has been helpful with managing her emotions as well as improving her sleep. She appreciates that the medication doesn't cause weight gain. Patient is requesting a refill for her medication. Patient does not need medication maintenance or dose adjustments at this time.  Patient is currently trying to figure out issues with her birth control and determining if she may need to undergo a hysterectomy.  Patient is currently concerned about health outside of psychiatric needs and is hopeful for solutions so that she is able to achieve her goals. 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 She states that the family she has been working with is very supportive of her needs and endeavors. 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  Patient denies suicidal and homicidal ideations. Patient further denies auditory and visual hallucinations. She reports fair appetite but manages to have on average 3 - 4 meals a day. She explains that she is trying to slowly wean herself off meat for the benefit of her health. Patient reports good sleep and receives roughly 8 - 9 hours of sleep a night. Patient denies tobacco, alcohol, an illicit drug use. Patient states that she will try to calm down and be more optimistic as she moves forward with her endeavors.  Visit Diagnosis: No diagnosis found.  Past Psychiatric History:  Past Medical History:  Past Medical History:  Diagnosis Date  . Abnormal uterine bleeding (AUB)   . Allergy   . Asthma   . Obesity   . PTSD (post-traumatic stress disorder)   . Schizo-affective psychosis (HCC)   . Sprains and strains of ankle and foot 12/15/2011   Right side; occurred on Wednesday 12/12/2011  . Urinary tract infection     Past Surgical History:  Procedure Laterality Date  . WISDOM TOOTH EXTRACTION      Family Psychiatric History:  Psychiatric history not documented  Family History:  Family History  Problem Relation Age of Onset  . Diabetes Mother   . Diabetes type II Mother   . Fibromyalgia Mother   . Hypertension Mother   . Clotting disorder Mother        pt states mom is prone to blood clots. unsure if has actual d/o  . Mental illness Father   . Mental illness Sister     Social History:   Social History   Socioeconomic History  . Marital status: Single    Spouse name: Not on file  . Number of children: Not on file  . Years of education: Not on file  . Highest education level: Not on file  Occupational History  . Not on file  Tobacco Use  . Smoking status: Never Smoker  . Smokeless tobacco: Never Used  Vaping Use  . Vaping Use: Never used  Substance and Sexual Activity  . Alcohol use: No  . Drug use: No  . Sexual activity: Never    Birth control/protection: None  Other Topics Concern  . Not on file  Social History Narrative  . Not on file   Social Determinants of Health   Financial Resource Strain:   . Difficulty of Paying Living Expenses: Not on file  Food Insecurity: No Food Insecurity  . Worried About Programme researcher, broadcasting/film/video in the Last Year: Never true  . Ran Out of Food in the Last Year: Never true  Transportation Needs: No Transportation Needs  . Lack of Transportation (Medical): No  . Lack of Transportation (Non-Medical): No  Physical Activity:   . Days of Exercise per Week: Not on file  . Minutes of Exercise per Session: Not on file  Stress:   . Feeling of Stress : Not on file  Social Connections:   . Frequency of Communication with Friends and Family: Not on file  . Frequency of Social Gatherings with Friends and Family: Not on file  . Attends Religious Services: Not on file  . Active Member of Clubs or Organizations: Not on file  . Attends Banker Meetings: Not on file  . Marital Status: Not on file    Allergies:  Allergies  Allergen Reactions  . Aripiprazole Other (See Comments)    Weight gain  . Grass Extracts [Gramineae Pollens]   . Peanut (Diagnostic)   . Risperidone Other (See Comments)    lactation  . Soy Allergy   . Seroquel [Quetiapine Fumerate] Rash    Metabolic Disorder Labs: Lab Results  Component Value Date   HGBA1C 5.7 (H) 07/28/2019   MPG 128 (H) 04/22/2011   MPG 129 09/25/2007   No results found  for: PROLACTIN Lab Results  Component Value Date   CHOL 179 (H) 04/22/2011   TRIG 36 04/22/2011   HDL 58 04/22/2011   CHOLHDL 3.1 04/22/2011   VLDL 7 04/22/2011   LDLCALC 114 (H) 04/22/2011   LDLCALC  09/25/2007    76        Total Cholesterol/HDL:CHD Risk Coronary Heart Disease Risk Table                     Men   Women  1/2 Average Risk   3.4   3.3   Lab Results  Component Value Date   TSH 0.934 04/22/2019   TSH 1.340 10/31/2018    Therapeutic Level Labs: No results found for: LITHIUM No results found for: VALPROATE No components found for:  CBMZ  Current Medications: Current Outpatient Medications  Medication Sig  Dispense Refill  . acetaminophen (TYLENOL) 500 MG tablet Take 1 tablet (500 mg total) by mouth every 6 (six) hours as needed. 30 tablet 0  . albuterol (ACCUNEB) 0.63 MG/3ML nebulizer solution Take 1 ampule by nebulization every 6 (six) hours as needed for wheezing or shortness of breath.     . cetirizine (ZYRTEC) 10 MG tablet TAKE 1 TABLET BY MOUTH EVERYDAY AT BEDTIME 90 tablet 2  . Cyanocobalamin (VITAMIN B-12) 1000 MCG SUBL Place under the tongue.    . Drospirenone (SLYND) 4 MG TABS Take 1 tablet by mouth daily. Take the placebo pills once every three months. 90 tablet 3  . fluticasone (FLONASE) 50 MCG/ACT nasal spray Place 2 sprays into both nostrils daily. 16 g 6  . ibuprofen (ADVIL) 600 MG tablet Take 1 tablet (600 mg total) by mouth every 6 (six) hours as needed for headache or cramping. 60 tablet 0  . meclizine (ANTIVERT) 25 MG tablet Take 1 tablet (25 mg total) by mouth 3 (three) times daily as needed for dizziness. (Patient not taking: Reported on 04/22/2019) 30 tablet 0  . Mefenamic Acid 250 MG CAPS 500mg  po at start of period symptoms and then 250mg  po q6h for up to 3 days (Patient not taking: Reported on 03/26/2019) 39 capsule 3  . norethindrone (AYGESTIN) 5 MG tablet Take 2 tablets (10 mg total) by mouth in the morning and at bedtime. 60 tablet 3  .  Pseudoephedrine-Acetaminophen (SUDAFED SINUS PO) Take by mouth.    VIIBRYD 20 MG TABS Take 1 tablet (20 mg total) by mouth daily. Once daily at bedtime. 30 tablet 0  . Vitamin D, Ergocalciferol, (DRISDOL) 1.25 MG (50000 UNIT) CAPS capsule TAKE 1 CAPSULE BY MOUTH EVERY 7 DAYS 12 capsule 1   No current facility-administered medications for this visit.     Musculoskeletal: Strength & Muscle Tone: Unable to assess due to telehealth visit Gait & Station: Unable to assess due to telehealth visit Patient leans: Unable to assess due to telehealth visit  Psychiatric Specialty Exam: Review of Systems  Psychiatric/Behavioral: Negative for hallucinations, self-injury, sleep disturbance and suicidal ideas. The patient is nervous/anxious.     There were no vitals taken for this visit.There is no height or weight on file to calculate BMI.  General Appearance: Well Groomed  Eye Contact:  Good  Speech:  Clear and Coherent and Normal Rate  Volume:  Normal  Mood:  Anxious and Pleasant  Affect:  Appropriate  Thought Process:  Coherent and Goal Directed  Orientation:  Full (Time, Place, and Person)  Thought Content: WDL and Logical   Suicidal Thoughts:  No  Homicidal Thoughts:  No  Memory:  Immediate;   Good Recent;   Good Remote;   Good  Judgement:  Good  Insight:  Good  Psychomotor Activity:  Normal  Concentration:  Concentration: Good and Attention Span: Good  Recall:  Good  Fund of Knowledge: Good  Language: Good  Akathisia:  NA  Handed:  Right  AIMS (if indicated): not done  Assets:  Communication Skills Desire for Improvement Social Support Vocational/Educational  ADL's:  Intact  Cognition: WNL  Sleep:  Good   Screenings: GAD-7     [facility]/2020 in [facility] Office Visit from 08/17/2019 in [facility] at 10/17/2019 for Women Office Visit from [facility]21/2019 in [facility] Office Visit from 12/26/2018 in [facility] Office Visit from 09/24/2018 in [facility]  Total GAD-7 Score 3 0 2 21 4     PHQ2-9     [facility]  PHQ-2 Total Score 0 0 0 0 0  PHQ-9 Total Score 0 1 0 -- 1       Assessment and Plan:  Tricia DarbyZhane A. Moeckel is a 24 year old female with a past psychiatric history significant for bipolar affective disorder, premenstrual dysphoric disorder, generalized anxiety disorder, and PTSD [facility] visit for follow up and medication management. Patient states she is currently Viibryd 20 mg for management of her mood. Patient reports that the medication has been helpful with managing her emotions as well as improving her sleep. Patient is requesting a refill for her medication. Patient does not need medication maintenance or dosage adjustments at this time.  1. Bipolar affective disorder, currently depressed, moderate (HCC)  - VIIBRYD 20 MG TABS; Take 1 tablet (20 mg total) by mouth daily. Once daily at bedtime.  Dispense: 30 tablet; Refill: 0  2. PMDD (premenstrual dysphoric disorder)  - VIIBRYD 20 MG TABS; Take 1 tablet (20 mg total) by mouth daily. Once daily at bedtime.  Dispense: 30 tablet; Refill: 0  3. Generalized anxiety disorder  - VIIBRYD 20 MG TABS; Take 1 tablet (20 mg total) by mouth daily. Once daily at bedtime.  Dispense: 30 tablet; Refill: 0  Patient to follow up in 3 months  Meta HatchetUchenna E Shondra Capps, PA 03/01/2020,  8:29 AM

## 2020-03-02 ENCOUNTER — Other Ambulatory Visit: Payer: Self-pay | Admitting: Obstetrics and Gynecology

## 2020-03-16 ENCOUNTER — Other Ambulatory Visit: Payer: Self-pay | Admitting: Obstetrics and Gynecology

## 2020-03-17 ENCOUNTER — Ambulatory Visit: Payer: Medicare Other | Admitting: Obstetrics and Gynecology

## 2020-04-25 ENCOUNTER — Other Ambulatory Visit: Payer: Medicare Other

## 2020-04-30 ENCOUNTER — Other Ambulatory Visit: Payer: Medicare Other

## 2020-04-30 DIAGNOSIS — Z20822 Contact with and (suspected) exposure to covid-19: Secondary | ICD-10-CM

## 2020-05-03 LAB — NOVEL CORONAVIRUS, NAA: SARS-CoV-2, NAA: NOT DETECTED

## 2020-05-12 ENCOUNTER — Encounter (HOSPITAL_COMMUNITY): Payer: Self-pay | Admitting: Physician Assistant

## 2020-05-26 ENCOUNTER — Telehealth (INDEPENDENT_AMBULATORY_CARE_PROVIDER_SITE_OTHER): Payer: Medicare Other | Admitting: Physician Assistant

## 2020-05-26 ENCOUNTER — Other Ambulatory Visit: Payer: Self-pay

## 2020-05-26 ENCOUNTER — Encounter (HOSPITAL_COMMUNITY): Payer: Self-pay | Admitting: Physician Assistant

## 2020-05-26 DIAGNOSIS — F4310 Post-traumatic stress disorder, unspecified: Secondary | ICD-10-CM | POA: Diagnosis not present

## 2020-05-26 DIAGNOSIS — F3132 Bipolar disorder, current episode depressed, moderate: Secondary | ICD-10-CM

## 2020-05-26 DIAGNOSIS — F3281 Premenstrual dysphoric disorder: Secondary | ICD-10-CM

## 2020-05-26 DIAGNOSIS — F411 Generalized anxiety disorder: Secondary | ICD-10-CM | POA: Diagnosis not present

## 2020-05-26 MED ORDER — VIIBRYD 20 MG PO TABS: 1.0000 | ORAL_TABLET | Freq: Every day | ORAL | 2 refills | Status: DC
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
## 2020-06-18 IMAGING — DX PORTABLE CHEST - 1 VIEW
1 series · 1 of 1 positions shown · non-contrast
Comparison: None.

CLINICAL DATA: Chest pain and shortness of breath.

EXAM:
PORTABLE CHEST 1 VIEW

[chest ap]
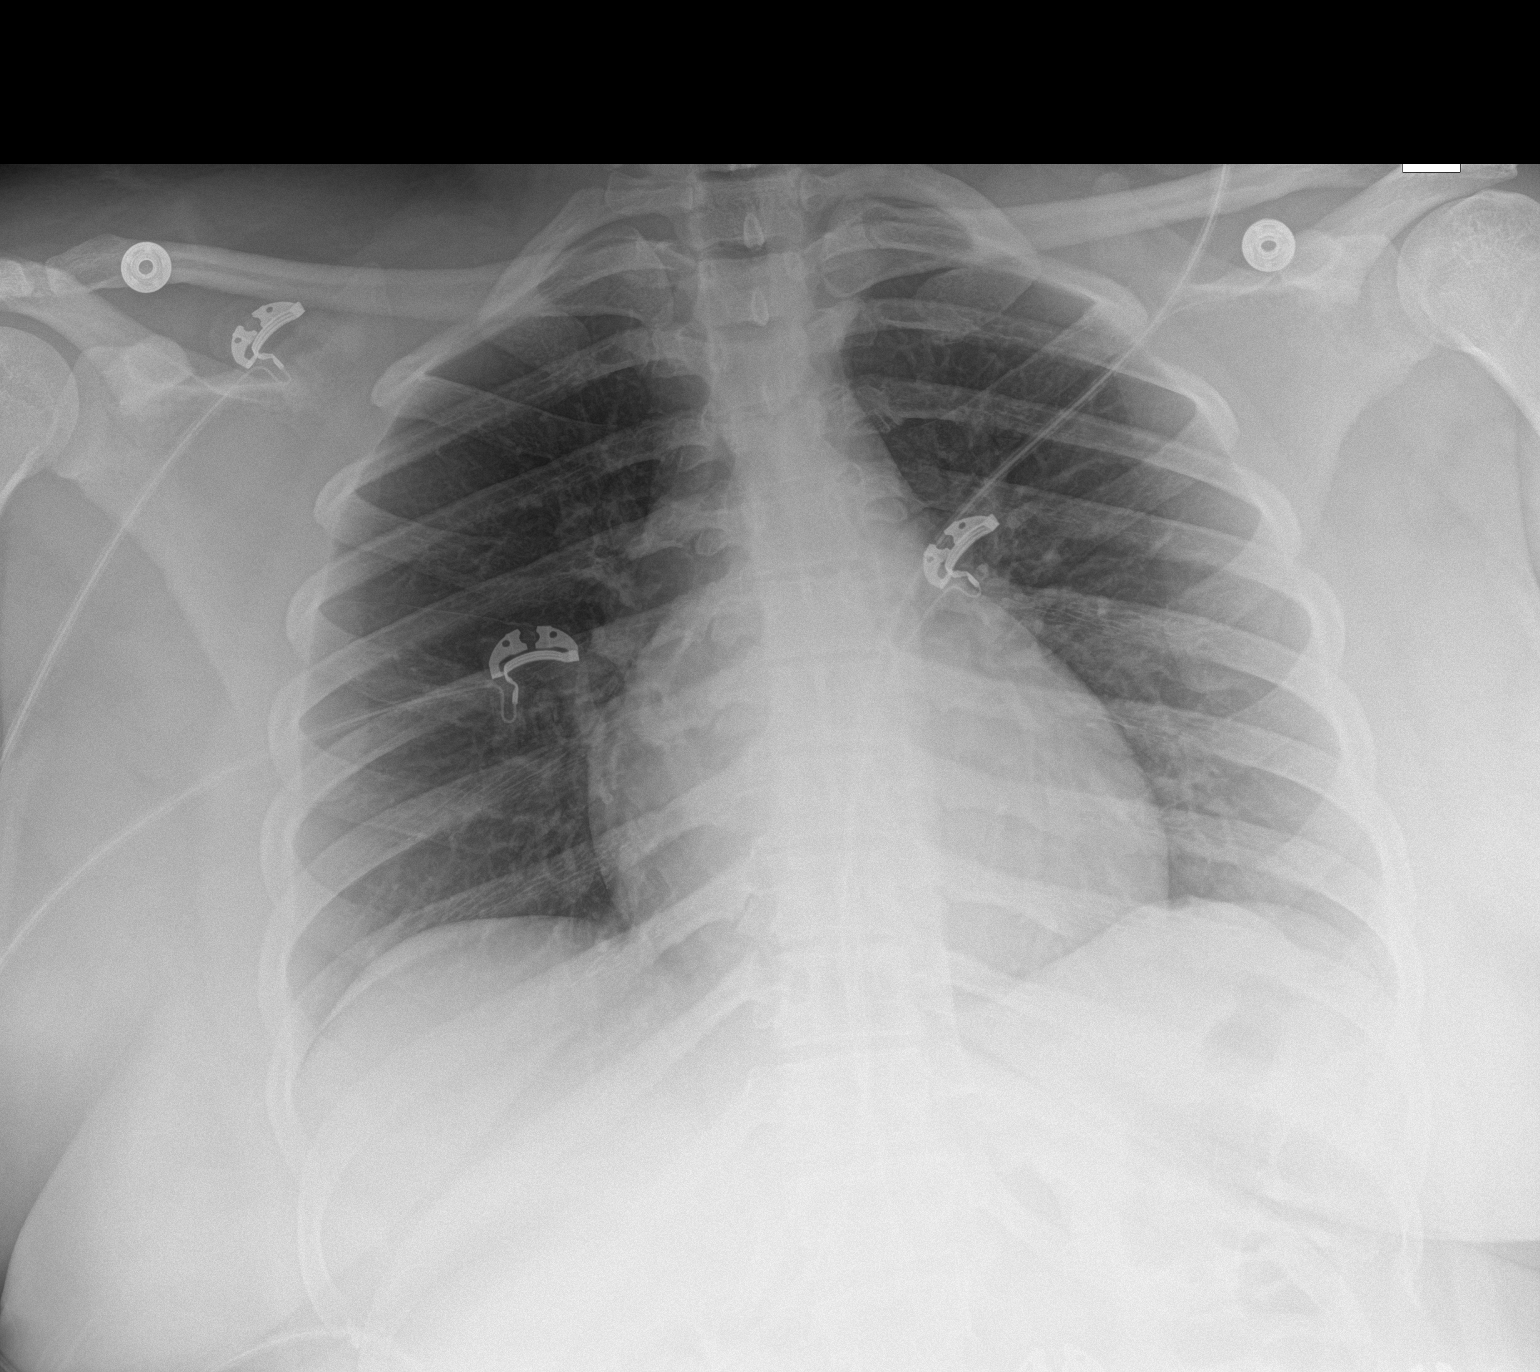

[1 of 1 positions shown; findings below may reference images not displayed]

FINDINGS: The cardiomediastinal contours are normal. The lungs are clear.
Pulmonary vasculature is normal. No consolidation, pleural effusion,
or pneumothorax. No acute osseous abnormalities are seen.
IMPRESSION: Negative AP view of the chest.

## 2020-06-23 IMAGING — US US PELVIS COMPLETE WITH TRANSVAGINAL
1 series · 15 of 25 positions shown · non-contrast
Comparison: None

CLINICAL DATA: Menorrhagia with regular cycle

EXAM:
TRANSABDOMINAL AND TRANSVAGINAL ULTRASOUND OF PELVIS
TECHNIQUE: Both transabdominal and transvaginal ultrasound examinations of the
pelvis were performed. Transabdominal technique was performed for
global imaging of the pelvis including uterus, ovaries, adnexal
regions, and pelvic cul-de-sac. It was necessary to proceed with
endovaginal exam following the transabdominal exam to visualize the
adnexa. Patient was unable to tolerate transvaginal imaging and
transvaginal imaging was limited to assessment of the endometrium.

[Series 1: us pelvis complete with transvaginal · 35 acquisitions, 15 frames shown]
[im 1/35]
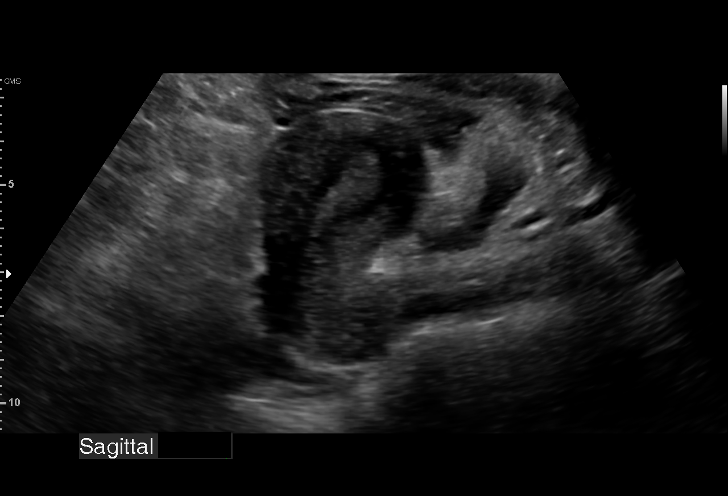
[im 3/35]
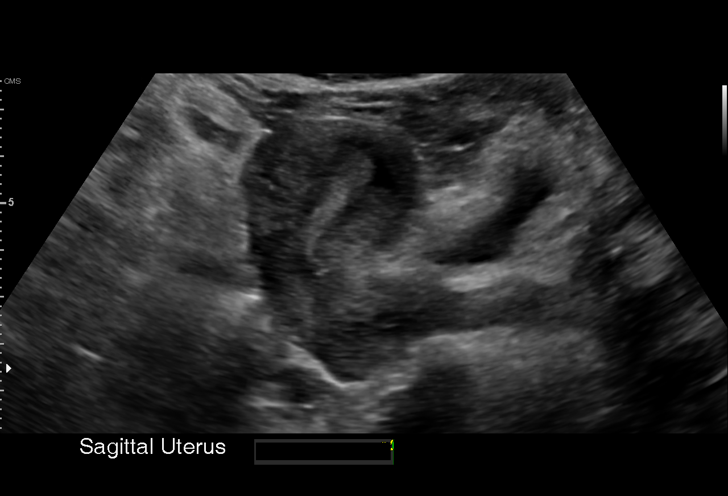
[im 6/35]
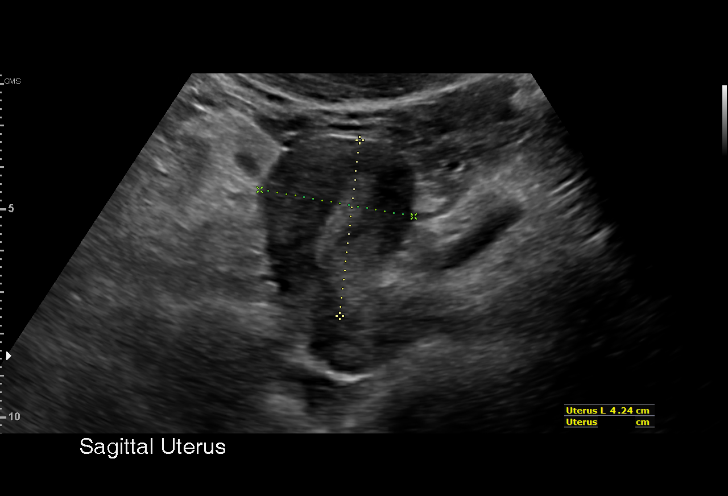
[im 8/35]
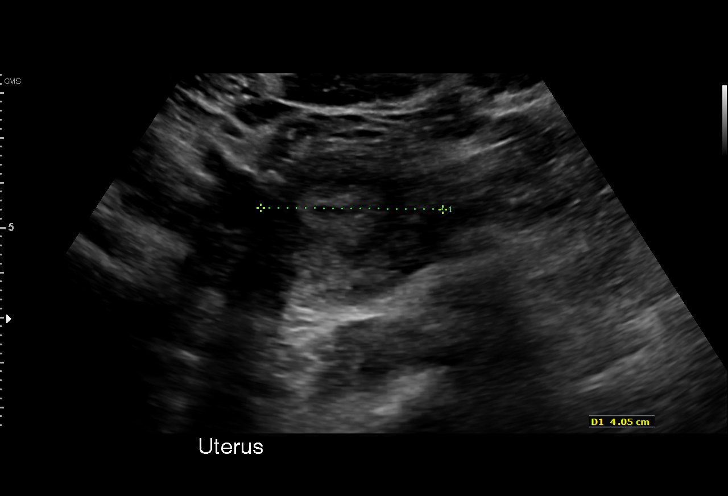
[im 10/35]
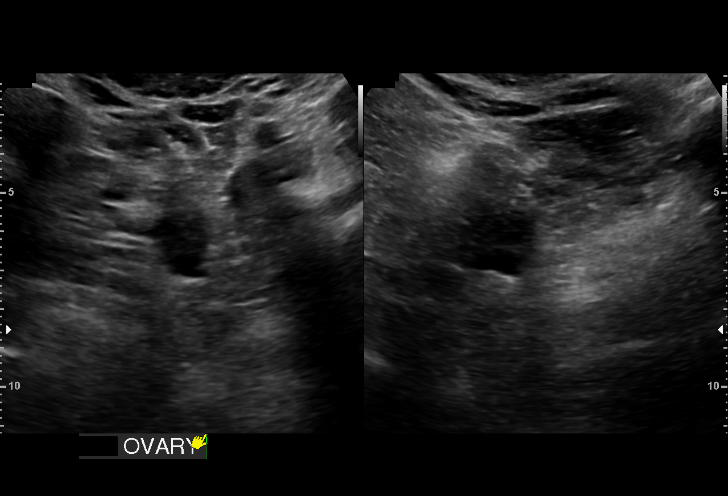
[im 13/35]
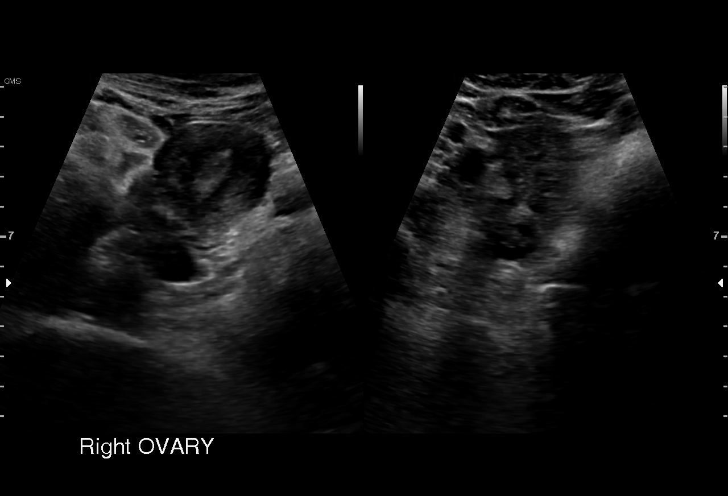
[im 15/35]
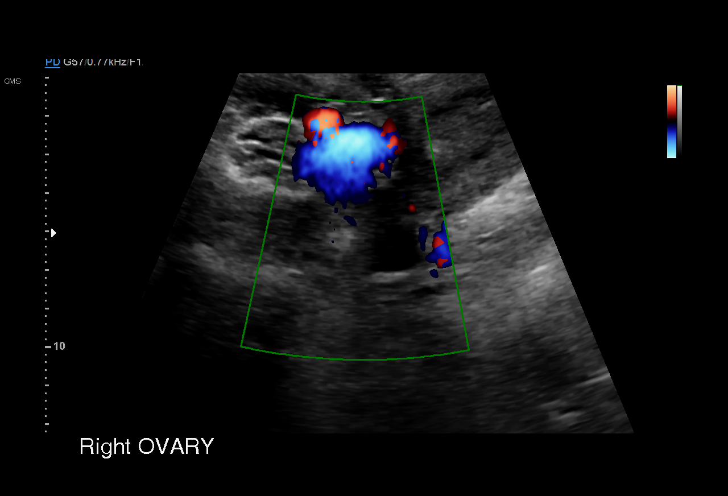
[im 18/35]
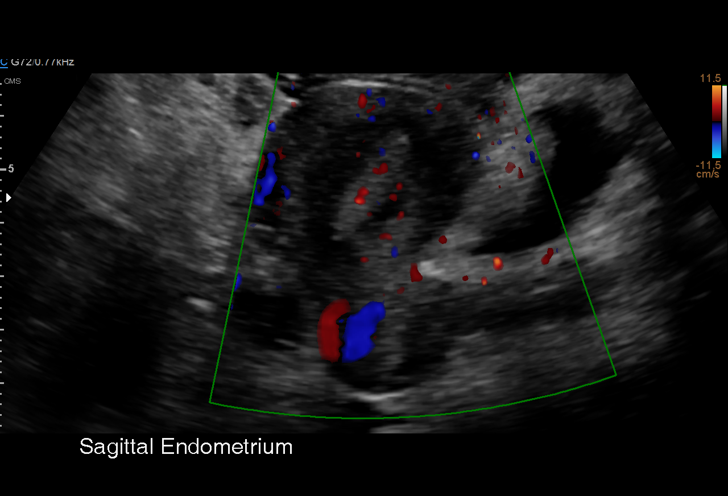
[im 20/35]
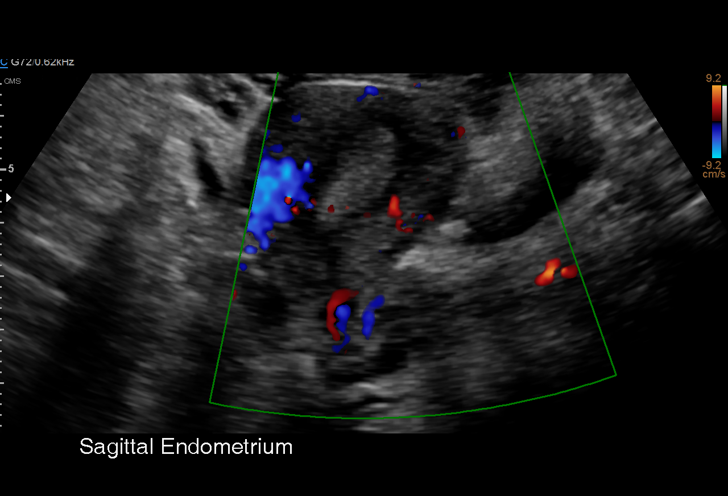
[im 22/35]
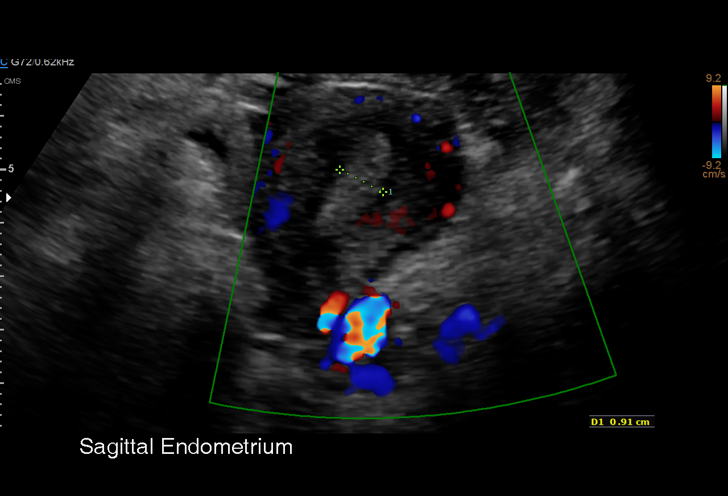
[im 25/35]
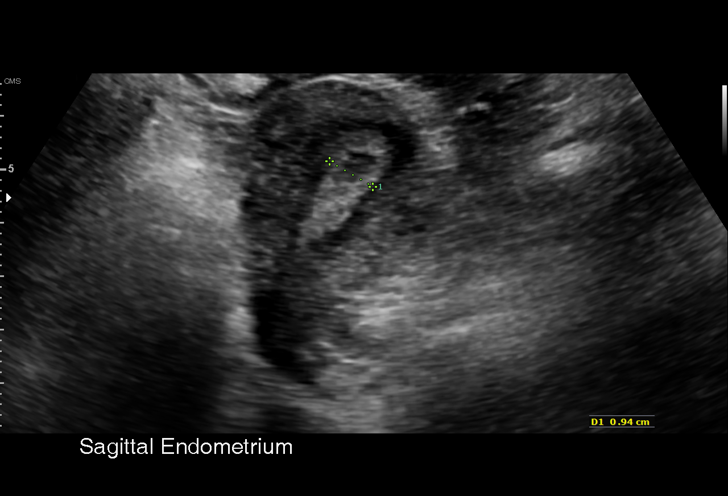
[im 27/35]
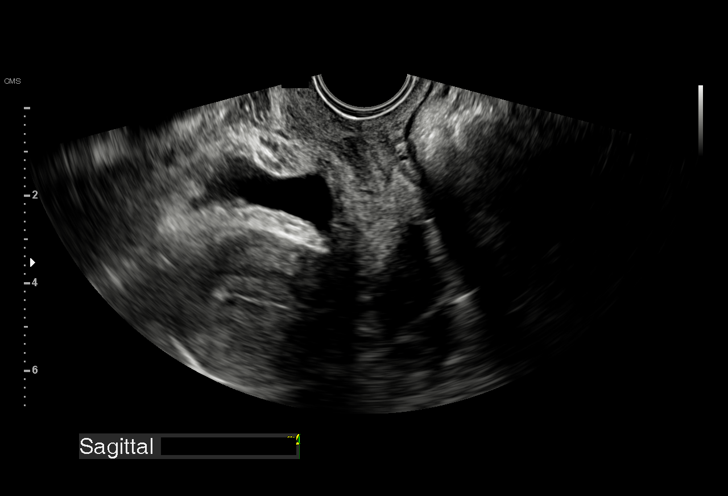
[im 29/35]
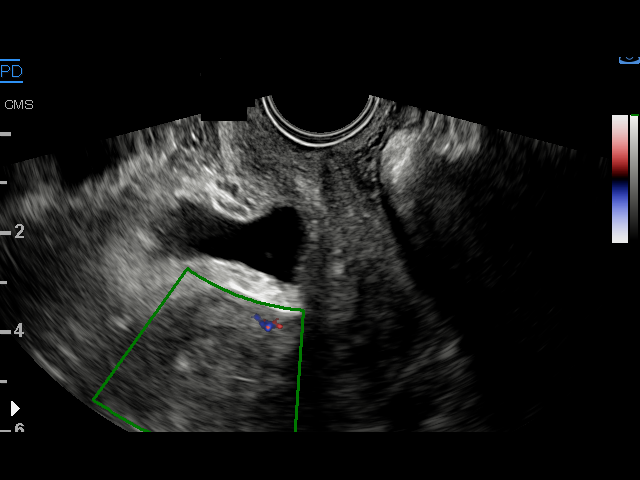
[im 32/35]
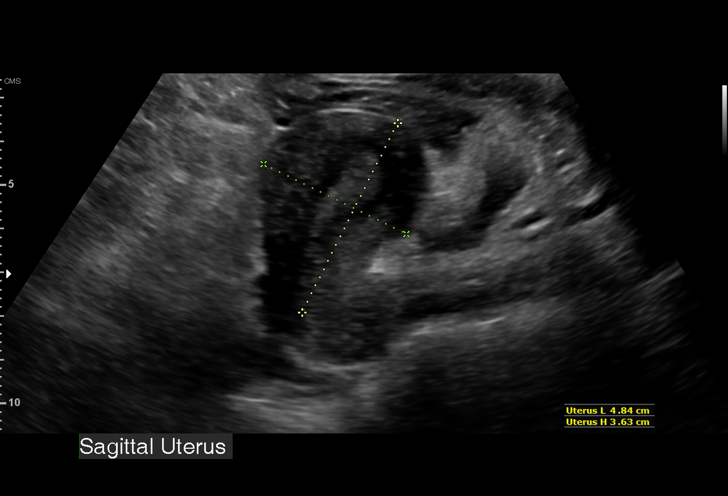
[im 35/35]
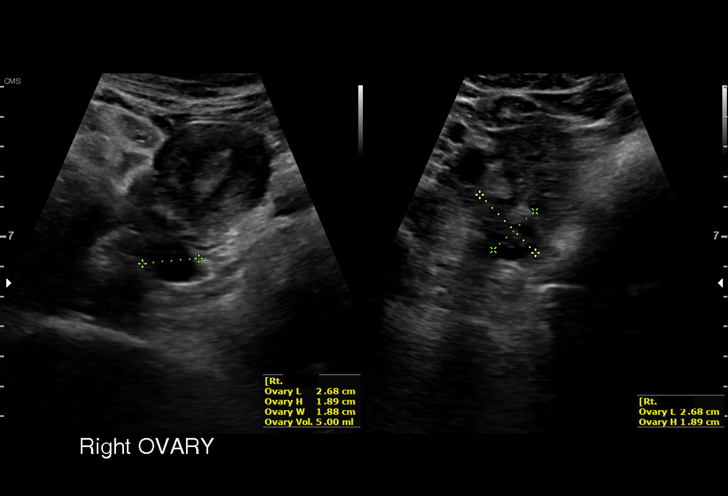

[15 of 25 positions shown; findings below may reference images not displayed]

FINDINGS: Uterus

Measurements: 4.8 x 3.6 x 4.4 cm = volume: 40 mL. Anteverted and
anteflexed. Normal morphology without mass

Endometrium

Thickness: 14 mm. Mildly heterogeneous echogenicity at the upper
uterine segment, question minimal fluid. No definite focal mass
lesion.

Right ovary

Measurements: 2.7 x 1.9 x 1.9 cm = volume: 5.0 mL. Normal morphology
without mass

Left ovary

Measurements: 2.4 x 1.9 x 1.6 cm = volume: 3.6 mL. Normal morphology
without mass

Other findings

No free pelvic fluid or adnexal masses.
IMPRESSION: Limited transvaginal imaging, patient unable to tolerate period

Question minimal endometrial fluid at the upper uterine segment.

Otherwise negative exam.

## 2020-07-18 ENCOUNTER — Encounter (HOSPITAL_COMMUNITY): Payer: Self-pay

## 2020-07-19 ENCOUNTER — Telehealth (HOSPITAL_COMMUNITY): Payer: Medicare Other | Admitting: Physician Assistant

## 2020-08-09 ENCOUNTER — Other Ambulatory Visit: Payer: Self-pay | Admitting: Nurse Practitioner

## 2020-08-09 DIAGNOSIS — J3089 Other allergic rhinitis: Secondary | ICD-10-CM

## 2020-08-09 NOTE — Telephone Encounter (Signed)
Requested medication (s) are due for refill today: yes  Requested medication (s) are on the active medication list: yes  Last refill: 05/12/2020  Future visit scheduled:  no  Notes to clinic:  overdue for follow up  Message sent to patient to contact office    Requested Prescriptions  Pending Prescriptions Disp Refills   cetirizine (ZYRTEC) 10 MG tablet [Pharmacy Med Name: CETIRIZINE HCL 10 MG TABLET] 90 tablet 2    Sig: TAKE 1 TABLET BY MOUTH EVERYDAY AT BEDTIME      Ear, Nose, and Throat:  Antihistamines Failed - 08/09/2020  9:18 AM      Failed - Valid encounter within last 12 months    Recent Outpatient Visits           1 year ago Menorrhagia with regular cycle   [facility] [place], Shea Stakes, NP   1 year ago Menorrhagia with regular cycle   [facility] [place], [place], [place]   1 year ago Iron deficiency anemia due to chronic blood loss   [facility] [place], Marzella Schlein, [place]   1 year ago Encounter for annual physical exam   [facility] [place], Shea Stakes, NP   1 year ago Abnormal uterine bleeding (AUB)   [facility] [place], Shea Stakes, NP

## 2020-08-22 ENCOUNTER — Telehealth (HOSPITAL_COMMUNITY): Payer: Medicare Other | Admitting: Physician Assistant

## 2020-08-24 ENCOUNTER — Telehealth (INDEPENDENT_AMBULATORY_CARE_PROVIDER_SITE_OTHER): Payer: Medicare Other | Admitting: Physician Assistant

## 2020-08-24 ENCOUNTER — Other Ambulatory Visit: Payer: Self-pay

## 2020-08-24 DIAGNOSIS — F4310 Post-traumatic stress disorder, unspecified: Secondary | ICD-10-CM

## 2020-08-24 DIAGNOSIS — F3132 Bipolar disorder, current episode depressed, moderate: Secondary | ICD-10-CM

## 2020-08-24 DIAGNOSIS — F411 Generalized anxiety disorder: Secondary | ICD-10-CM

## 2020-08-24 DIAGNOSIS — F3281 Premenstrual dysphoric disorder: Secondary | ICD-10-CM | POA: Diagnosis not present

## 2020-08-24 MED ORDER — VIIBRYD 20 MG PO TABS: 1.0000 | ORAL_TABLET | Freq: Every day | ORAL | 2 refills | Status: DC

## 2020-08-24 NOTE — Progress Notes (Signed)
BH MD/PA/NP OP Progress Note  Virtual Visit via Telephone Note  I connected with Tricia Scott on 08/24/20 at  3:30 PM EDT by telephone and verified that I am speaking with the correct person using two identifiers.  Location: Patient: Home Provider: Clinic   I discussed the limitations, risks, security and privacy concerns of performing an evaluation and management service by telephone and the availability of in person appointments. I also discussed with the patient that there may be a patient responsible charge related to this service. The patient expressed understanding and agreed to proceed.  Follow Up Instructions:  I discussed the assessment and treatment plan with the patient. The patient was provided an opportunity to ask questions and all were answered. The patient agreed with the plan and demonstrated an understanding of the instructions.   The patient was advised to call back or seek an in-person evaluation if the symptoms worsen or if the condition fails to improve as anticipated.  I provided  24 minutes of non-face-to-face time during this encounter.  Meta HatchetUchenna E Gwendlyn Hanback, PA   08/24/2020 6:10 PM Tricia DarbyZhane A Scott  MRN:  [id number]  Chief Complaint: Follow up and medication management  HPI:   Tricia Scott is a 25 year old female with a past psychiatric history significant for bipolar affective disorder, premenstrual dysphoric disorder, generalized anxiety disorder, and PTSD who presents to [facility] via virtual telephone visit for follow-up and medication management.    Patient is currently being managed on Viibryd 20 mg for her bipolar disorder, premenstrual dysphoric disorder, and generalized anxiety disorder.  Patient reports no issues or concerns regarding her current medication regimen.  Patient denies the need for dosage adjustments at this time and is requesting refills following conclusion of the encounter.  Patient reports  that she is still getting used to being free of receiving disability and working full-time.  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  Patient's main stressor today is her dnc procedure she will be undergoing soon.  GAD-7 screen was performed with the patient scoring an 8.  Patient is pleasant, calm, cooperative, and fully engaged in conversation during the encounter.  Patient reports that she is drowsy, hungry, and confused.  Patient denies suicidal or homicidal ideations.  She further denies auditory or visual hallucinations.  Patient endorses good sleep and receives on average 6 to 8 hours of sleep each night.  Patient endorses good appetite and eats on average 2 meals per day.  Patient denies alcohol use, tobacco use, and illicit drug use.  Visit Diagnosis:    ICD-10-CM   1. Bipolar affective disorder, currently depressed, moderate (HCC)  F31.32   2. PMDD (premenstrual dysphoric disorder)  F32.81   3. Generalized anxiety disorder  F41.1   4. PTSD (post-traumatic stress disorder)  F43.10     Past Psychiatric History:  Bipolar depression PTSD Generalized anxiety disorder Premenstrual dysphoric disorder  Past Medical History:  Past Medical History:  Diagnosis Date  . Abnormal uterine bleeding (AUB)   . Allergy   . Asthma   . Obesity   . PTSD (post-traumatic stress disorder)   . Schizo-affective psychosis (HCC)   . Sprains and strains of ankle and foot 12/15/2011   Right side; occurred on Wednesday 12/12/2011  . Urinary tract infection     Past Surgical History:  Procedure Laterality Date  . WISDOM TOOTH EXTRACTION      Family Psychiatric History:  Psychiatric history not  documented  Family History:  Family History  Problem Relation Age of Onset  . Diabetes Mother   . Diabetes type II Mother   . Fibromyalgia Mother   . Hypertension Mother   . Clotting disorder Mother        pt states mom is prone to blood clots. unsure if has actual d/o  .  Mental illness Father   . Mental illness Sister     Social History:  Social History   Socioeconomic History  . Marital status: Single    Spouse name: Not on file  . Number of children: Not on file  . Years of education: Not on file  . Highest education level: Not on file  Occupational History  . Not on file  Tobacco Use  . Smoking status: Never Smoker  . Smokeless tobacco: Never Used  Vaping Use  . Vaping Use: Never used  Substance and Sexual Activity  . Alcohol use: No  . Drug use: No  . Sexual activity: Never    Birth control/protection: None  Other Topics Concern  . Not on file  Social History Narrative  . Not on file   Social Determinants of Health   Financial Resource Strain: Not on file  Food Insecurity: Not on file  Transportation Needs: Not on file  Physical Activity: Not on file  Stress: Not on file  Social Connections: Not on file    Allergies:  Allergies  Allergen Reactions  . Aripiprazole Other (See Comments)    Weight gain  . Grass Extracts [Gramineae Pollens]   . Peanut (Diagnostic)   . Risperidone Other (See Comments)    lactation  . Soy Allergy   . Seroquel [Quetiapine Fumerate] Rash    Metabolic Disorder Labs: Lab Results  Component Value Date   HGBA1C 5.7 (H) 07/28/2019   MPG 128 (H) 04/22/2011   MPG 129 09/25/2007   No results found for: PROLACTIN Lab Results  Component Value Date   CHOL 179 (H) 04/22/2011   TRIG 36 04/22/2011   HDL 58 04/22/2011   CHOLHDL 3.1 04/22/2011   VLDL 7 04/22/2011   LDLCALC 114 (H) 04/22/2011   LDLCALC  09/25/2007    76        Total Cholesterol/HDL:CHD Risk Coronary Heart Disease Risk Table                     Men   Women  1/2 Average Risk   3.4   3.3   Lab Results  Component Value Date   TSH 0.934 04/22/2019   TSH 1.340 10/31/2018    Therapeutic Level Labs: No results found for: LITHIUM No results found for: VALPROATE No components found for:  CBMZ  Current Medications: Current  Outpatient Medications  Medication Sig Dispense Refill  . acetaminophen (TYLENOL) 500 MG tablet Take 1 tablet (500 mg total) by mouth every 6 (six) hours as needed. 30 tablet 0  . albuterol (ACCUNEB) 0.63 MG/3ML nebulizer solution Take 1 ampule by nebulization every 6 (six) hours as needed for wheezing or shortness of breath.     . cetirizine (ZYRTEC) 10 MG tablet TAKE 1 TABLET BY MOUTH EVERYDAY AT BEDTIME 90 tablet 2  . Cyanocobalamin (VITAMIN B-12) 1000 MCG SUBL Place under the tongue.    . fluticasone (FLONASE) 50 MCG/ACT nasal spray Place 2 sprays into both nostrils daily. 16 g 6  . ibuprofen (ADVIL) 600 MG tablet Take 1 tablet (600 mg total) by mouth every 6 (six) hours as  needed for headache or cramping. 60 tablet 0  . meclizine (ANTIVERT) 25 MG tablet Take 1 tablet (25 mg total) by mouth 3 (three) times daily as needed for dizziness. (Patient not taking: Reported on 04/22/2019) 30 tablet 0  . Mefenamic Acid 250 MG CAPS 500mg  po at start of period symptoms and then 250mg  po q6h for up to 3 days (Patient not taking: Reported on 03/26/2019) 39 capsule 3  . norethindrone (AYGESTIN) 5 MG tablet Take 2 tablets (10 mg total) by mouth in the morning and at bedtime. 60 tablet 3  . Pseudoephedrine-Acetaminophen (SUDAFED SINUS PO) Take by mouth.    VIIBRYD 20 MG TABS Take 1 tablet (20 mg total) by mouth daily. Once daily at bedtime. 30 tablet 2  . Vitamin D, Ergocalciferol, (DRISDOL) 1.25 MG (50000 UNIT) CAPS capsule TAKE 1 CAPSULE BY MOUTH EVERY 7 DAYS 12 capsule 1   No current facility-administered medications for this visit.     Musculoskeletal: Strength & Muscle Tone: Unable to assess due to telemedicine visit  Gait & Station: Unable to assess due to telemedicine visit  Patient leans: Unable to assess due to telemedicine visit   Psychiatric Specialty Exam: Review of Systems  Psychiatric/Behavioral: Negative for dysphoric mood, hallucinations, self-injury, sleep disturbance and suicidal  ideas. The patient is not nervous/anxious and is not hyperactive.     There were no vitals taken for this visit.There is no height or weight on file to calculate BMI.  General Appearance: Unable to assess due to telemedicine visit   Eye Contact:  Unable to assess due to telemedicine visit   Speech:  Clear and Coherent and Normal Rate  Volume:  Normal  Mood:  Euthymic  Affect:  Appropriate  Thought Process:  Coherent and Descriptions of Associations: Intact  Orientation:  Full (Time, Place, and Person)  Thought Content: WDL and Logical   Suicidal Thoughts:  No  Homicidal Thoughts:  No  Memory:  Immediate;   Good Recent;   Good Remote;   Good  Judgement:  Good  Insight:  Good  Psychomotor Activity:  Normal  Concentration:  Concentration: Good and Attention Span: Good  Recall:  Good  Fund of Knowledge: Good  Language: Good  Akathisia:  NA  Handed:  Right  AIMS (if indicated): not done  Assets:  Communication Skills Desire for Improvement Social Support Vocational/Educational  ADL's:  Intact  Cognition: WNL  Sleep:  Good   Screenings: GAD-7   Flowsheet Row Video Visit from 08/24/2020 in [facility] Office Visit from 01/22/2020 in Center for [facility] Healthcare at [facility] Office Visit from 08/17/2019 in Center for [facility] at 10/17/2019 for Women Office Visit from 07/21/2019 in [facility] Office Visit from 12/26/2018 in [facility]  Total GAD-7 Score 8 3 0 2 21    PHQ2-9   Flowsheet Row Video Visit from 08/24/2020 in [facility] Office Visit from 01/22/2020 in Center for [facility] Healthcare at [facility] Women Office Visit from 08/17/2019 in Center for [facility] at 10/17/2019 for Women Office Visit from 07/21/2019 in [facility] Office Visit from 03/26/2019 in [facility]  PHQ-2 Total Score 0 0 0 0 0  PHQ-9 Total Score -- 0 1 0 --    Flowsheet Row Video Visit from 08/24/2020 in [facility]  C-SSRS RISK CATEGORY No Risk       Assessment and Plan:   Tricia Scott is a 25 year old female with a past psychiatric history significant for bipolar affective disorder, premenstrual dysphoric disorder, generalized anxiety disorder, and PTSD who presents to [facility] via virtual telephone visit for follow-up and medication management.  Patient reports no issues or concerns regarding her current medication regimen.  Patient denies the need for dosage adjustments at this time and is requesting refills on her medication.  Patient to continue taking medication as prescribed.  Patient's medication will be e-prescribed to pharmacy of choice.  1. Bipolar affective disorder, currently depressed, moderate (HCC)  - VIIBRYD 20 MG TABS; Take 1 tablet (20 mg total) by mouth daily. Once daily at bedtime.  Dispense: 30 tablet; Refill: 2  2. PMDD (premenstrual dysphoric disorder)  - VIIBRYD 20 MG TABS; Take 1 tablet (20 mg total) by mouth daily. Once daily at bedtime.  Dispense: 30 tablet; Refill: 2  3. Generalized anxiety disorder  - VIIBRYD 20 MG TABS; Take 1 tablet (20 mg total) by mouth daily. Once daily at bedtime.  Dispense: 30 tablet; Refill: 2  4. PTSD (post-traumatic stress disorder)  Patient to follow-up in 2 months  Meta Hatchet, PA 08/24/2020, 6:10 PM

## 2020-08-25 ENCOUNTER — Encounter (HOSPITAL_COMMUNITY): Payer: Self-pay | Admitting: Physician Assistant

## 2020-10-28 ENCOUNTER — Telehealth (INDEPENDENT_AMBULATORY_CARE_PROVIDER_SITE_OTHER): Payer: Medicare Other | Admitting: Physician Assistant

## 2020-10-28 ENCOUNTER — Other Ambulatory Visit: Payer: Self-pay

## 2020-10-28 DIAGNOSIS — F3132 Bipolar disorder, current episode depressed, moderate: Secondary | ICD-10-CM | POA: Diagnosis not present

## 2020-10-28 DIAGNOSIS — F411 Generalized anxiety disorder: Secondary | ICD-10-CM | POA: Diagnosis not present

## 2020-10-28 DIAGNOSIS — F3281 Premenstrual dysphoric disorder: Secondary | ICD-10-CM

## 2020-10-28 MED ORDER — VIIBRYD 20 MG PO TABS: 1.0000 | ORAL_TABLET | Freq: Every day | ORAL | 2 refills | Status: DC

## 2020-12-19 ENCOUNTER — Encounter (HOSPITAL_COMMUNITY): Payer: Self-pay | Admitting: Physician Assistant

## 2020-12-19 NOTE — Progress Notes (Addendum)
BH MD/PA/NP OP Progress Note  Virtual Visit via Video Note  I connected with Tricia Scott on 10/28/20 at 11:00 AM EDT by a video enabled telemedicine application and verified that I am speaking with the correct person using two identifiers.  Location: Patient: Home Provider: Clinic   I discussed the limitations of evaluation and management by telemedicine and the availability of in person appointments. The patient expressed understanding and agreed to proceed.  Follow Up Instructions:  I discussed the assessment and treatment plan with the patient. The patient was provided an opportunity to ask questions and all were answered. The patient agreed with the plan and demonstrated an understanding of the instructions.   The patient was advised to call back or seek an in-person evaluation if the symptoms worsen or if the condition fails to improve as anticipated.  I provided 20 minutes of non-face-to-face time during this encounter.  Meta Hatchet, PA    10/28/2020 11:34 AM HAZELLE WOOLLARD  MRN:  [id number]  Chief Complaint: Follow up and medication management  HPI:   Maryetta Shafer is a 25 year old female with a past psychiatric history significant for bipolar affective disorder, premenstrual dysphoric disorder, generalized anxiety disorder, and PTSD who presents to [facility] via virtual video visit for follow-up and medication management.  Patient is currently being managed on the following medication: Viibryd 20 mg daily for the management of her bipolar disorder, premenstrual dysphoric disorder, and generalized anxiety disorder.  Patient reports no issues or concerns regarding her current medication regimen.  Patient denies the need for dosage adjustments at this time and is requesting refills on her medication.  Patient denies experiencing any depressive symptoms or anxiety.  The only stressor that the patient has is being 25.  Patient  denies any other issues regarding her mental health.  Patient has aspirations to be a Doula and states that there is a [employer] teen Nurse, mental health as she is interested in possibly pursuing.  Patient is still working as a Social worker.  A GAD-7 screen was performed with the patient scoring a 2.  Patient is is alert and oriented x4, pleasant, calm, cooperative, and fully engaged in conversation during the encounter.  Patient endorses being in a happy mood but states that she occasionally gets anxious while in a social setting.  Patient denies suicidal or homicidal ideations.  She further denies auditory or visual hallucinations and does not appear to be responding to internal/external stimuli.  Patient endorses good sleep and receives on average 8 to 10 hours of sleep each night.  Patient endorses good appetite and eats on average 2 meals with snacking in between.  Patient denies alcohol consumption, tobacco use, and illicit drug use.  Visit Diagnosis:    ICD-10-CM   1. Bipolar affective disorder, currently depressed, moderate (HCC)  F31.32 VIIBRYD 20 MG TABS    2. PMDD (premenstrual dysphoric disorder)  F32.81 VIIBRYD 20 MG TABS    3. Generalized anxiety disorder  F41.1 VIIBRYD 20 MG TABS      Past Psychiatric History:  Bipolar depression PTSD Generalized anxiety disorder Premenstrual dysphoric disorder  Past Medical History:  Past Medical History:  Diagnosis Date   Abnormal uterine bleeding (AUB)    Allergy    Asthma    Obesity    PTSD (post-traumatic stress disorder)    Schizo-affective psychosis (HCC)    Sprains and strains of ankle and foot 12/15/2011   Right side; occurred on Wednesday 12/12/2011  Urinary tract infection     Past Surgical History:  Procedure Laterality Date   WISDOM TOOTH EXTRACTION      Family Psychiatric History:  Psychiatric history not documented  Family History:  Family History  Problem Relation Age of Onset   Diabetes Mother    Diabetes  type II Mother    Fibromyalgia Mother    Hypertension Mother    Clotting disorder Mother        pt states mom is prone to blood clots. unsure if has actual d/o   Mental illness Father    Mental illness Sister     Social History:  Social History   Socioeconomic History   Marital status: Single    Spouse name: Not on file   Number of children: Not on file   Years of education: Not on file   Highest education level: Not on file  Occupational History   Not on file  Tobacco Use   Smoking status: Never   Smokeless tobacco: Never  Vaping Use   Vaping Use: Never used  Substance and Sexual Activity   Alcohol use: No   Drug use: No   Sexual activity: Never    Birth control/protection: None  Other Topics Concern   Not on file  Social History Narrative   Not on file   Social Determinants of Health   Financial Resource Strain: Not on file  Food Insecurity: Not on file  Transportation Needs: Not on file  Physical Activity: Not on file  Stress: Not on file  Social Connections: Not on file    Allergies:  Allergies  Allergen Reactions   Aripiprazole Other (See Comments)    Weight gain   Grass Extracts [Gramineae Pollens]    Peanut (Diagnostic)    Risperidone Other (See Comments)    lactation   Soy Allergy    Seroquel [Quetiapine Fumerate] Rash    Metabolic Disorder Labs: Lab Results  Component Value Date   HGBA1C 5.7 (H) 07/28/2019   MPG 128 (H) 04/22/2011   MPG 129 09/25/2007   No results found for: PROLACTIN Lab Results  Component Value Date   CHOL 179 (H) 04/22/2011   TRIG 36 04/22/2011   HDL 58 04/22/2011   CHOLHDL 3.1 04/22/2011   VLDL 7 04/22/2011   LDLCALC 114 (H) 04/22/2011   LDLCALC  09/25/2007    76        Total Cholesterol/HDL:CHD Risk Coronary Heart Disease Risk Table                     Men   Women  1/2 Average Risk   3.4   3.3   Lab Results  Component Value Date   TSH 0.934 04/22/2019   TSH 1.340 10/31/2018    Therapeutic Level  Labs: No results found for: LITHIUM No results found for: VALPROATE No components found for:  CBMZ  Current Medications: Current Outpatient Medications  Medication Sig Dispense Refill   acetaminophen (TYLENOL) 500 MG tablet Take 1 tablet (500 mg total) by mouth every 6 (six) hours as needed. 30 tablet 0   albuterol (ACCUNEB) 0.63 MG/3ML nebulizer solution Take 1 ampule by nebulization every 6 (six) hours as needed for wheezing or shortness of breath.      cetirizine (ZYRTEC) 10 MG tablet TAKE 1 TABLET BY MOUTH EVERYDAY AT BEDTIME 90 tablet 2   Cyanocobalamin (VITAMIN B-12) 1000 MCG SUBL Place under the tongue.     fluticasone (FLONASE) 50 MCG/ACT nasal spray Place  2 sprays into both nostrils daily. 16 g 6   ibuprofen (ADVIL) 600 MG tablet Take 1 tablet (600 mg total) by mouth every 6 (six) hours as needed for headache or cramping. 60 tablet 0   meclizine (ANTIVERT) 25 MG tablet Take 1 tablet (25 mg total) by mouth 3 (three) times daily as needed for dizziness. (Patient not taking: Reported on 04/22/2019) 30 tablet 0   Mefenamic Acid 250 MG CAPS 500mg  po at start of period symptoms and then 250mg  po q6h for up to 3 days (Patient not taking: Reported on 03/26/2019) 39 capsule 3   norethindrone (AYGESTIN) 5 MG tablet Take 2 tablets (10 mg total) by mouth in the morning and at bedtime. 60 tablet 3   Pseudoephedrine-Acetaminophen (SUDAFED SINUS PO) Take by mouth.     VIIBRYD 20 MG TABS Take 1 tablet (20 mg total) by mouth daily. Once daily at bedtime. 30 tablet 2   Vitamin D, Ergocalciferol, (DRISDOL) 1.25 MG (50000 UNIT) CAPS capsule TAKE 1 CAPSULE BY MOUTH EVERY 7 DAYS 12 capsule 1   No current facility-administered medications for this visit.     Musculoskeletal: Strength & Muscle Tone: Unable to assess due to telemedicine visit Gait & Station: Unable to assess due to telemedicine visit Patient leans: Unable to assess due to telemedicine visit  Psychiatric Specialty Exam: Review of Systems   Psychiatric/Behavioral:  Negative for decreased concentration, dysphoric mood, hallucinations, self-injury, sleep disturbance and suicidal ideas. The patient is not nervous/anxious and is not hyperactive.    There were no vitals taken for this visit.There is no height or weight on file to calculate BMI.  General Appearance: Well Groomed  Eye Contact:  Good  Speech:  Clear and Coherent and Normal Rate  Volume:  Normal  Mood:  Euthymic  Affect:  Appropriate  Thought Process:  Coherent and Descriptions of Associations: Intact  Orientation:  Full (Time, Place, and Person)  Thought Content: WDL   Suicidal Thoughts:  No  Homicidal Thoughts:  No  Memory:  Immediate;   Good Recent;   Good Remote;   Good  Judgement:  Good  Insight:  Good  Psychomotor Activity:  Normal  Concentration:  Concentration: Good and Attention Span: Good  Recall:  Good  Fund of Knowledge: Good  Language: Good  Akathisia:  NA  Handed:  Right  AIMS (if indicated): not done  Assets:  Communication Skills Desire for Improvement Social Support Vocational/Educational  ADL's:  Intact  Cognition: WNL  Sleep:  Good   Screenings: GAD-7    Flowsheet Row Video Visit from 10/28/2020 in [facility] Video Visit from 08/24/2020 in [facility] Office Visit from 01/22/2020 in Center for [facility] Healthcare at [facility] for Women Office Visit from 08/17/2019 in Center for [facility] Healthcare at 10/17/2019 for Women Office Visit from 07/21/2019 in [facility]  Total GAD-7 Score 2 8 3  0 2      PHQ2-9    Flowsheet Row Video Visit from 10/28/2020 in [facility] Video Visit from 08/24/2020 in [facility] Office Visit from 01/22/2020 in Center for 10/24/2020 Healthcare at [facility] for Women Office Visit from 08/17/2019 in Center for [facility] Healthcare at [facility] for Women Office Visit from 07/21/2019 in [facility]  PHQ-2 Total Score 0 0 0 0 0  PHQ-9 Total Score -- -- 0 1 0  Flowsheet Row Video Visit from 10/28/2020 in [facility] Video Visit from 08/24/2020 in [facility]  C-SSRS RISK CATEGORY No Risk No Risk        Assessment and Plan:   Ladashia Demarinis is a 25 year old female with a past psychiatric history significant for bipolar affective disorder, premenstrual dysphoric disorder, generalized anxiety disorder, and PTSD who presents to [facility] via virtual video visit for follow-up and medication management.  Patient reports no issues or concerns regarding her current medication regimen.  Patient denies the need for dosage adjustments at this time and is requesting refills on her medication.  Patient's medication to be e-prescribed to pharmacy of choice.  1. Bipolar affective disorder, currently depressed, moderate (HCC)  - VIIBRYD 20 MG TABS; Take 1 tablet (20 mg total) by mouth daily. Once daily at bedtime.  Dispense: 30 tablet; Refill: 2  2. PMDD (premenstrual dysphoric disorder)  - VIIBRYD 20 MG TABS; Take 1 tablet (20 mg total) by mouth daily. Once daily at bedtime.  Dispense: 30 tablet; Refill: 2  3. Generalized anxiety disorder  - VIIBRYD 20 MG TABS; Take 1 tablet (20 mg total) by mouth daily. Once daily at bedtime.  Dispense: 30 tablet; Refill: 2  Patient to follow up in 2 months Provider spent a total of 20 minutes with the patient/reviewing patient's chart  Meta Hatchet, PA 10/28/2020, 11:34 AM

## 2020-12-30 ENCOUNTER — Telehealth (INDEPENDENT_AMBULATORY_CARE_PROVIDER_SITE_OTHER): Payer: Medicare Other | Admitting: Physician Assistant

## 2020-12-30 DIAGNOSIS — F3281 Premenstrual dysphoric disorder: Secondary | ICD-10-CM | POA: Diagnosis not present

## 2020-12-30 DIAGNOSIS — F411 Generalized anxiety disorder: Secondary | ICD-10-CM

## 2020-12-30 DIAGNOSIS — F3132 Bipolar disorder, current episode depressed, moderate: Secondary | ICD-10-CM
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
## 2021-01-02 ENCOUNTER — Encounter (HOSPITAL_COMMUNITY): Payer: Self-pay | Admitting: Physician Assistant

## 2021-01-02 MED ORDER — VIIBRYD 20 MG PO TABS: 1.0000 | ORAL_TABLET | Freq: Every day | ORAL | 2 refills | Status: DC

## 2021-03-29 ENCOUNTER — Encounter: Payer: Self-pay | Admitting: Hematology

## 2021-03-29 ENCOUNTER — Encounter (HOSPITAL_COMMUNITY): Payer: Self-pay | Admitting: Emergency Medicine

## 2021-03-29 ENCOUNTER — Ambulatory Visit (HOSPITAL_COMMUNITY)
Admission: EM | Admit: 2021-03-29 | Discharge: 2021-03-29 | Disposition: A | Discharge status: Home / Self Care | Payer: Medicare Other | Attending: Internal Medicine | Admitting: Internal Medicine

## 2021-03-29 ENCOUNTER — Ambulatory Visit (INDEPENDENT_AMBULATORY_CARE_PROVIDER_SITE_OTHER): Payer: Medicare Other

## 2021-03-29 ENCOUNTER — Other Ambulatory Visit: Payer: Self-pay

## 2021-03-29 DIAGNOSIS — S93402A Sprain of unspecified ligament of left ankle, initial encounter: Secondary | ICD-10-CM

## 2021-03-29 DIAGNOSIS — M25572 Pain in left ankle and joints of left foot: Secondary | ICD-10-CM

## 2021-03-29 NOTE — Discharge Instructions (Signed)
Your x-ray did not show a fracture, you have sprained your ankle.  You can use the ankle brace over the next month while you are working and with activity for 6 to 12 months afterwards.  You can also use the crutches as needed over the next few days.  I recommend working on early range of motion exercises by drawing the alphabet with your ankle as we discussed.  You can also use ice at night and keep it elevated as much as possible.  Tylenol or ibuprofen can be used for pain.  If you are not improving in about 1 month, I recommend follow-up with the sports medicine clinic.  Remember that ankle sprains can take about 6 to 8 weeks to completely resolve.

## 2021-03-29 NOTE — ED Triage Notes (Signed)
Pt presents with left ankle pain after rolling ankle yesterday.

## 2021-03-29 NOTE — ED Provider Notes (Signed)
[facility]    CSN: [id number] Arrival date & time: 03/29/21  7829      History   Chief Complaint Chief Complaint  Patient presents with   Ankle Pain    Left    HPI Tricia Scott is a 25 y.o. female.   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 when she was trying to move past someone in a pew and sprained her ankle Reports pain over the lateral aspect of her left ankle in the region of her lateral malleolus and ATFL Reports some swelling States it has been painful to walk but was able to walk immediately afterwards No numbness and tingling No prior injuries to the ankle    Past Medical History:  Diagnosis Date   Abnormal uterine bleeding (AUB)    Allergy    Asthma    Obesity    PTSD (post-traumatic stress disorder)    Schizo-affective psychosis (HCC)    Sprains and strains of ankle and foot 12/15/2011   Right side; occurred on Wednesday 12/12/2011   Urinary tract infection     Patient Active Problem List   Diagnosis Date Noted   Bipolar affective disorder, currently depressed, moderate (HCC) 08/24/2020   PTSD (post-traumatic stress disorder) 11/18/2019   Bipolar disorder, unspecified (HCC) 11/18/2019   PMDD (premenstrual dysphoric disorder) 11/18/2019   Iron deficiency anemia due to chronic blood loss 01/06/2019   Schizoaffective disorder (HCC) 11/30/2018   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 Authorizing Provider  acetaminophen (TYLENOL) 500 MG tablet Take 1 tablet (500 mg total) by mouth every 6 (six) hours as needed. 01/12/18   Law, Waylan Boga, PA-C  albuterol (ACCUNEB) 0.63 MG/3ML nebulizer solution Take 1 ampule by nebulization every 6 (six) hours as needed for wheezing or shortness of breath.     [provider]  cetirizine (ZYRTEC) 10 MG tablet TAKE 1 TABLET BY MOUTH EVERYDAY AT BEDTIME 11/03/19   Claiborne Rigg, NP  Cyanocobalamin (VITAMIN B-12) 1000 MCG SUBL Place under the tongue.    [provider]  fluticasone (FLONASE) 50 MCG/ACT nasal spray Place 2 sprays into both nostrils daily. 03/26/19   Anders Simmonds, PA-C  ibuprofen (ADVIL) 600 MG tablet Take 1 tablet (600 mg total) by mouth every 6 (six) hours as needed for headache or cramping. 09/24/18   Claiborne Rigg, NP  meclizine (ANTIVERT) 25 MG tablet Take 1 tablet (25 mg total) by mouth 3 (three) times daily as needed for dizziness. Patient not taking: Reported on 04/22/2019 03/15/19   Lurline Idol, FNP  Mefenamic Acid 250 MG CAPS 500mg  po at start of period symptoms and then 250mg  po q6h for up to 3 days Patient not taking: Reported on 03/26/2019 03/13/19   14/01/2019, MD  norethindrone (AYGESTIN) 5 MG tablet Take 2 tablets (10 mg total) by mouth in the morning and at bedtime. 02/23/20   [place] Bing, MD  Pseudoephedrine-Acetaminophen (SUDAFED SINUS PO) Take by mouth.    [provider]  VIIBRYD 20 MG TABS Take 1 tablet (20 mg total) by mouth daily. Once daily at bedtime. 01/02/21   [place], [place], PA  Vitamin D, Ergocalciferol, (DRISDOL) 1.25 MG (50000 UNIT) CAPS capsule TAKE 1 CAPSULE BY MOUTH EVERY 7 DAYS 07/07/19   Tommas Olp, MD  cromolyn (NASALCROM) 5.2 MG/ACT nasal spray Place 1 spray into both nostrils 4 (four) times daily. Patient not  taking: Reported on 03/11/2019 09/24/18 03/15/19  11/24/18, NP  iron polysaccharides (NIFEREX) 150 MG capsule Take 1 capsule (150 mg total) by mouth daily. Patient not taking: Reported on 11/24/2018 11/12/18 03/15/19  11/14/18, MD    Family History Family History  Problem Relation Age of Onset   Diabetes Mother    Diabetes type II Mother    Fibromyalgia Mother    Hypertension Mother    Clotting disorder Mother        pt states mom is prone to blood clots. unsure if has actual d/o   Mental illness Father    Mental illness Sister     Social History Social History   Tobacco Use   Smoking status: Never   Smokeless tobacco: Never  Vaping Use   Vaping Use: Never used  Substance Use Topics   Alcohol use: No   Drug use: No     Allergies   Aripiprazole, Grass extracts [gramineae pollens], Peanut (diagnostic), Risperidone, Soy allergy, and Seroquel [quetiapine fumerate]   Review of Systems Review of Systems  All other systems reviewed and are negative.  Per HPI Physical Exam Triage Vital Signs ED Triage Vitals  Enc Vitals Group     BP 03/29/21 0842 (!) 142/87     Pulse Rate 03/29/21 0842 84     Resp 03/29/21 0842 17     Temp 03/29/21 0842 98.2 F (36.8 C)     Temp Source 03/29/21 0842 Oral     SpO2 03/29/21 0842 98 %     Weight --      Height --      Head Circumference --      Peak Flow --      Pain Score 03/29/21 0840 5     Pain Loc --      Pain Edu? --      Excl. in GC? --    No data found.  Updated Vital Signs BP (!) 142/87 (BP Location: Right Arm)    Pulse 84    Temp 98.2 F (36.8 C) (Oral)    Resp 17    LMP 03/28/2021    SpO2 98%   Visual Acuity Right Eye Distance:   Left Eye Distance:   Bilateral Distance:    Right Eye Near:   Left Eye Near:    Bilateral Near:     Physical Exam Constitutional:      General: She is not in acute distress.    Appearance: Normal appearance. She is not ill-appearing or toxic-appearing.  HENT:      Head: Normocephalic and atraumatic.  Pulmonary:     Effort: Pulmonary effort is normal.  Musculoskeletal:     Comments: Left Ankle: - Inspection: There is some swelling over the sinus tarsi on the left compared to the right, however no ecchymosis or overlying skin changes noted.  No obvious deformities of bones. - Palpation: No TTP at MT heads, no TTP at base of 5th MT, no TTP over cuboid, no tenderness over navicular prominence.  There is some mild tenderness palpation over the lateral malleolus, worse over the sinus tarsi in the region of the ATFL. No sign of peroneal tendon subluxation or TTP. - Strength: Normal strength with dorsiflexion, plantarflexion, inversion, and eversion of foot; flexion and extension of toes b/l.  She has pain in all fields. - ROM: Full ROM b/l with pain. - Neuro/vasc: NV intact distally bilaterally - Special Tests: Negative anterior drawer, normal inversion test.  Negative syndesmotic compression.     Skin:    General: Skin is warm and dry.  Neurological:     General: No focal deficit present.     Mental Status: She is alert and oriented to person, place, and time.     Sensory: No sensory deficit.     UC Treatments / Results  Labs (all labs ordered are listed, but only abnormal results are displayed) Labs Reviewed - No data to display  EKG   Radiology DG Ankle Complete Left  Result Date: 03/29/2021 CLINICAL DATA:  Left ankle pain after inversion injury. EXAM: LEFT ANKLE COMPLETE - 3+ VIEW COMPARISON:  None. FINDINGS: Lateral soft tissue swelling.  No fracture or dislocation. IMPRESSION: Lateral soft tissue swelling consistent with ligamentous injury. No fracture or dislocation. Electronically Signed   By: Paulina Fusi M.D.   On: 03/29/2021 09:21    Procedures Procedures (including critical care time)  Medications Ordered in UC Medications - No data to display  Initial Impression / Assessment and Plan / UC Course  I have reviewed the triage  vital signs and the nursing notes.  Pertinent labs & imaging results that were available during my care of the patient were reviewed by me and considered in my medical decision making (see chart for details).     X-ray negative for fracture, consistent with ankle sprain.  We will place an ASO and give crutches to use as needed over the next few days.  Given a note for work to allow her to have crutches as well.  Recommended follow-up with sports medicine if not improving in about a month.  Did discuss typical ankle injury recovery and recommended early range of motion exercises, rest, elevation, ibuprofen or Tylenol as needed for pain, icing. Final Clinical Impressions(s) / UC Diagnoses   Final diagnoses:  Sprain of left ankle, unspecified ligament, initial encounter     Discharge Instructions      Your x-ray did not show a fracture, you have sprained your ankle.  You can use the ankle brace over the next month while you are working and with activity for 6 to 12 months afterwards.  You can also use the crutches as needed over the next few days.  I recommend working on early range of motion exercises by drawing the alphabet with your ankle as we discussed.  You can also use ice at night and keep it elevated as much as possible.  Tylenol or ibuprofen can be used for pain.  If you are not improving in about 1 month, I recommend follow-up with the sports medicine clinic.  Remember that ankle sprains can take about 6 to 8 weeks to completely  resolve.     ED Prescriptions   None    PDMP not reviewed this encounter.   Unknown Jim, DO 03/29/21 [phone]

## 2021-03-30 ENCOUNTER — Telehealth (HOSPITAL_COMMUNITY): Payer: Self-pay | Admitting: Emergency Medicine

## 2021-03-30 NOTE — Telephone Encounter (Signed)
Patient returned to [facility] asking if she could use boot instead of current orthopedic equipment.  Found it impossible to do her job.    Brought question to Dr Leonides Grills, agreed to providing a cam walker to patient.

## 2021-03-30 NOTE — Telephone Encounter (Signed)
Applied medium cam walker to left ankle.  Given instructions for use.

## 2021-04-28 ENCOUNTER — Encounter (HOSPITAL_COMMUNITY): Payer: Self-pay | Admitting: Physician Assistant

## 2021-04-28 ENCOUNTER — Telehealth (INDEPENDENT_AMBULATORY_CARE_PROVIDER_SITE_OTHER): Payer: Medicare Other | Admitting: Physician Assistant

## 2021-04-28 ENCOUNTER — Encounter: Payer: Self-pay | Admitting: Hematology

## 2021-04-28 DIAGNOSIS — F3281 Premenstrual dysphoric disorder: Secondary | ICD-10-CM | POA: Diagnosis not present

## 2021-04-28 DIAGNOSIS — F411 Generalized anxiety disorder: Secondary | ICD-10-CM

## 2021-04-28 DIAGNOSIS — F3132 Bipolar disorder, current episode depressed, moderate: Secondary | ICD-10-CM

## 2021-04-28 NOTE — Progress Notes (Signed)
BH MD/PA/NP OP Progress Note  Virtual Visit via Telephone Note  I connected with Tricia Scott on 04/28/21 at 10:00 AM EST by telephone and verified that I am speaking with the correct person using two identifiers.  Location: Patient: Home Provider: Clinic   I discussed the limitations, risks, security and privacy concerns of performing an evaluation and management service by telephone and the availability of in person appointments. I also discussed with the patient that there may be a patient responsible charge related to this service. The patient expressed understanding and agreed to proceed.  Follow Up Instructions:  I discussed the assessment and treatment plan with the patient. The patient was provided an opportunity to ask questions and all were answered. The patient agreed with the plan and demonstrated an understanding of the instructions.   The patient was advised to call back or seek an in-person evaluation if the symptoms worsen or if the condition fails to improve as anticipated.  I provided 19 minutes of non-face-to-face time during this encounter.  Meta Hatchet, PA    04/28/2021 10:12 AM DEAIRA LECKEY  MRN:  [id number]  Chief Complaint: Follow up and medication management   HPI:   Tricia Scott is a 26 year old female with a past psychiatric history significant for bipolar disorder, premenstrual dysphoric disorder, generalized anxiety disorder, and PTSD who presents to [facility] via virtual telephone visit for follow-up and medication management.  Patient is currently being managed on the following medication: Viibryd 20 mg daily.  Patient reports no issues or concerns regarding her current medication regimen.  She reports that her anxiety has been off the charts and attributes her worsening anxiety to recently losing a friend.  Patient states that she is doing okay with processing her friend's passing, she reports not  sleeping well at all.  Patient states that she has tried relaxing and drinking sleeping tea but has had no relief from her sleeping disturbances.  Patient denies any depressive episodes but notes more anxiousness and excitability due to her friend not being there to comfort her.  Patient notes that she has found herself calling her friends and mother more often due to her friend's recent passing.  Patient reports that she is still able to work and that life has been relatively good.  A GAD-7 screen was performed with the patient scoring a 9.  Patient is alert and oriented x4, calm, cooperative, and fully engaged in conversation during the encounter.  Patient endorses okay mood.  Patient denies suicidal or homicidal ideation.  She further denies auditory or visual hallucinations and does not appear to be responding to internal/external stimuli.  Patient endorses fair sleep stating that she wakes up from time to time each night.  Patient endorses good appetite and eats on average 2-3 meals per day.  Patient denies alcohol consumption, tobacco use, and illicit drug use.  Visit Diagnosis:    ICD-10-CM   1. Bipolar affective disorder, currently depressed, moderate (HCC)  F31.32 VIIBRYD 20 MG TABS    2. PMDD (premenstrual dysphoric disorder)  F32.81 VIIBRYD 20 MG TABS    3. Generalized anxiety disorder  F41.1 VIIBRYD 20 MG TABS      Past Psychiatric History:  Bipolar depression PTSD Generalized anxiety disorder Premenstrual dysphoric disorder  Past Medical History:  Past Medical History:  Diagnosis Date   Abnormal uterine bleeding (AUB)    Allergy    Asthma    Obesity    PTSD (post-traumatic  stress disorder)    Schizo-affective psychosis (HCC)    Sprains and strains of ankle and foot 12/15/2011   Right side; occurred on Wednesday 12/12/2011   Urinary tract infection     Past Surgical History:  Procedure Laterality Date   WISDOM TOOTH EXTRACTION      Family Psychiatric History:   Psychiatric history not documented  Family History:  Family History  Problem Relation Age of Onset   Diabetes Mother    Diabetes type II Mother    Fibromyalgia Mother    Hypertension Mother    Clotting disorder Mother        pt states mom is prone to blood clots. unsure if has actual d/o   Mental illness Father    Mental illness Sister     Social History:  Social History   Socioeconomic History   Marital status: Single    Spouse name: Not on file   Number of children: Not on file   Years of education: Not on file   Highest education level: Not on file  Occupational History   Not on file  Tobacco Use   Smoking status: Never   Smokeless tobacco: Never  Vaping Use   Vaping Use: Never used  Substance and Sexual Activity   Alcohol use: No   Drug use: No   Sexual activity: Never    Birth control/protection: None  Other Topics Concern   Not on file  Social History Narrative   Not on file   Social Determinants of Health   Financial Resource Strain: Not on file  Food Insecurity: Not on file  Transportation Needs: Not on file  Physical Activity: Not on file  Stress: Not on file  Social Connections: Not on file    Allergies:  Allergies  Allergen Reactions   Aripiprazole Other (See Comments)    Weight gain   Grass Extracts [Gramineae Pollens]    Peanut (Diagnostic)    Risperidone Other (See Comments)    lactation   Soy Allergy    Seroquel [Quetiapine Fumerate] Rash    Metabolic Disorder Labs: Lab Results  Component Value Date   HGBA1C 5.7 (H) 07/28/2019   MPG 128 (H) 04/22/2011   MPG 129 09/25/2007   No results found for: PROLACTIN Lab Results  Component Value Date   CHOL 179 (H) 04/22/2011   TRIG 36 04/22/2011   HDL 58 04/22/2011   CHOLHDL 3.1 04/22/2011   VLDL 7 04/22/2011   LDLCALC 114 (H) 04/22/2011   LDLCALC  09/25/2007    76        Total Cholesterol/HDL:CHD Risk Coronary Heart Disease Risk Table                     Men   Women  1/2  Average Risk   3.4   3.3   Lab Results  Component Value Date   TSH 0.934 04/22/2019   TSH 1.340 10/31/2018    Therapeutic Level Labs: No results found for: LITHIUM No results found for: VALPROATE No components found for:  CBMZ  Current Medications: Current Outpatient Medications  Medication Sig Dispense Refill   acetaminophen (TYLENOL) 500 MG tablet Take 1 tablet (500 mg total) by mouth every 6 (six) hours as needed. 30 tablet 0   albuterol (ACCUNEB) 0.63 MG/3ML nebulizer solution Take 1 ampule by nebulization every 6 (six) hours as needed for wheezing or shortness of breath.      cetirizine (ZYRTEC) 10 MG tablet TAKE 1 TABLET BY MOUTH  EVERYDAY AT BEDTIME 90 tablet 2   Cyanocobalamin (VITAMIN B-12) 1000 MCG SUBL Place under the tongue.     fluticasone (FLONASE) 50 MCG/ACT nasal spray Place 2 sprays into both nostrils daily. 16 g 6   ibuprofen (ADVIL) 600 MG tablet Take 1 tablet (600 mg total) by mouth every 6 (six) hours as needed for headache or cramping. 60 tablet 0   meclizine (ANTIVERT) 25 MG tablet Take 1 tablet (25 mg total) by mouth 3 (three) times daily as needed for dizziness. (Patient not taking: Reported on 04/22/2019) 30 tablet 0   Mefenamic Acid 250 MG CAPS 500mg  po at start of period symptoms and then 250mg  po q6h for up to 3 days (Patient not taking: Reported on 03/26/2019) 39 capsule 3   norethindrone (AYGESTIN) 5 MG tablet Take 2 tablets (10 mg total) by mouth in the morning and at bedtime. 60 tablet 3   Pseudoephedrine-Acetaminophen (SUDAFED SINUS PO) Take by mouth.     VIIBRYD 20 MG TABS Take 1 tablet (20 mg total) by mouth daily. Once daily at bedtime. 30 tablet 2   Vitamin D, Ergocalciferol, (DRISDOL) 1.25 MG (50000 UNIT) CAPS capsule TAKE 1 CAPSULE BY MOUTH EVERY 7 DAYS 12 capsule 1   No current facility-administered medications for this visit.     Musculoskeletal: Strength & Muscle Tone: Unable to assess due to telemedicine visit Gait & Station: Unable to  assess due to telemedicine visit Patient leans: Unable to assess due to telemedicine visit  Psychiatric Specialty Exam: Review of Systems  Psychiatric/Behavioral:  Negative for decreased concentration, dysphoric mood, hallucinations, self-injury, sleep disturbance and suicidal ideas. The patient is nervous/anxious. The patient is not hyperactive.    There were no vitals taken for this visit.There is no height or weight on file to calculate BMI.  General Appearance: Unable to assess due to telemedicine visit  Eye Contact:  Unable to assess due to telemedicine visit  Speech:  Clear and Coherent and Normal Rate  Volume:  Normal  Mood:  Anxious and Euthymic  Affect:  Appropriate and Congruent  Thought Process:  Coherent and Descriptions of Associations: Intact  Orientation:  Full (Time, Place, and Person)  Thought Content: WDL   Suicidal Thoughts:  No  Homicidal Thoughts:  No  Memory:  Immediate;   Good Recent;   Good Remote;   Good  Judgement:  Good  Insight:  Good  Psychomotor Activity:  Normal  Concentration:  Concentration: Good and Attention Span: Good  Recall:  Good  Fund of Knowledge: Good  Language: Good  Akathisia:  NA  Handed:  Right  AIMS (if indicated): not done  Assets:  Communication Skills Desire for Improvement Housing Social Support Vocational/Educational  ADL's:  Intact  Cognition: WNL  Sleep:  Good   Screenings: GAD-7    Flowsheet Row [facility]  Total GAD-7 Score 9 17 2 8 3       PHQ2-9    Flowsheet Row [facility]  [facility]  Visit [facility]/22/2020 [facility]  PHQ-2 Total Score 0 1 0 0 0  PHQ-9 Total Score -- -- -- -- 0      Flowsheet Row [facility]  C-SSRS RISK CATEGORY No Risk Error: Question 6 not populated No Risk        Assessment and Plan:   Tricia DarbyZhane A. Scott is a 26 year old female with a past psychiatric history significant for bipolar disorder, premenstrual dysphoric disorder, generalized anxiety disorder, and PTSD [facility] virtual telephone visit for follow-up and medication management.  Patient denies depressive episodes but states that she has been experiencing anxiousness and increased excitability from the recent passing of her friend.  In addition to anxiety, patient notes that she has also been having difficulty with receiving sleep.  Patient is refusing medication management for her anxiety and sleep disturbances and states that she will continue taking her Viibryd as prescribed.  Patient continues to appear to be in good spirits and informed provider that she has other good social support network.  Patient's medication to be e- prescribed to pharmacy of choice.  1. Bipolar affective disorder, currently depressed, moderate (HCC)  - VIIBRYD 20 MG TABS; Take 1 tablet (20 mg total) by mouth daily. Once daily at bedtime.  Dispense: 30 tablet; Refill: 2  2. PMDD (premenstrual dysphoric disorder)  - VIIBRYD 20 MG TABS; Take 1 tablet (20 mg total) by mouth daily. Once daily at bedtime.  Dispense: 30  tablet; Refill: 2  3. Generalized anxiety disorder  - VIIBRYD 20 MG TABS; Take 1 tablet (20 mg total) by mouth daily. Once daily at bedtime.  Dispense: 30 tablet; Refill: 2  Patient to follow up in 3 months Provider spent a total of 19 minutes with the patient/reviewing patient's chart  Meta HatchetUchenna E Alleen Kehm, PA 04/28/2021, 10:12 AM

## 2021-04-29 MED ORDER — VIIBRYD 20 MG PO TABS: 1.0000 | ORAL_TABLET | Freq: Every day | ORAL | 2 refills | Status: DC

## 2021-06-22 ENCOUNTER — Telehealth (HOSPITAL_COMMUNITY): Payer: Self-pay | Admitting: Physician Assistant

## 2021-06-22 NOTE — Telephone Encounter (Signed)
Patient contacted office to speak with provider. Patient informed Clinical research associate that she feels she needs to update him on a few things. Her mother recently pass in February, her funeral was recently held and she isn't feeling the best. Requesting a call from provider tomorrow morning stating that she will be sleeping the rest of the day.  ?

## 2021-06-27 NOTE — Telephone Encounter (Signed)
Provider was contacted by Earnestine Mealing regarding patient's request to talk to provider. Provider was able to reach out to patient.

## 2021-07-28 ENCOUNTER — Telehealth (INDEPENDENT_AMBULATORY_CARE_PROVIDER_SITE_OTHER): Payer: Medicare Other | Admitting: Physician Assistant

## 2021-07-28 ENCOUNTER — Encounter (HOSPITAL_COMMUNITY): Payer: Self-pay | Admitting: Physician Assistant

## 2021-07-28 DIAGNOSIS — F3281 Premenstrual dysphoric disorder: Secondary | ICD-10-CM | POA: Diagnosis not present

## 2021-07-28 DIAGNOSIS — F411 Generalized anxiety disorder: Secondary | ICD-10-CM | POA: Diagnosis not present

## 2021-07-28 DIAGNOSIS — F3132 Bipolar disorder, current episode depressed, moderate: Secondary | ICD-10-CM

## 2021-07-28 MED ORDER — VIIBRYD 20 MG PO TABS: 1.0000 | ORAL_TABLET | Freq: Every day | ORAL | 2 refills | Status: DC

## 2021-07-28 NOTE — Progress Notes (Signed)
BH MD/PA/NP OP Progress Note ? ?Virtual Visit via Video Note ? ?I connected with Tricia Scott on 07/28/21 at 11:00 AM EDT by a video enabled telemedicine application and verified that I am speaking with the correct person using two identifiers. ? ?Location: ?Patient: Home ?Provider: Clinic ?  ?I discussed the limitations of evaluation and management by telemedicine and the availability of in person appointments. The patient expressed understanding and agreed to proceed. ? ?Follow Up Instructions: ? ?I discussed the assessment and treatment plan with the patient. The patient was provided an opportunity to ask questions and all were answered. The patient agreed with the plan and demonstrated an understanding of the instructions. ?  ?The patient was advised to call back or seek an in-person evaluation if the symptoms worsen or if the condition fails to improve as anticipated. ? ?I provided 20 minutes of non-face-to-face time during this encounter. ? ?Malachy Mood, PA ? ? ?07/28/2021 11:09 AM ?Tricia Scott  ?MRN:  [id number] ? ?Chief Complaint:  ?Chief Complaint  ?Patient presents with  ? Follow-up  ? Medication Refill  ? ?HPI:  ? ?Tricia Scott is a 26 year old female with a past psychiatric history significant for bipolar disorder, premenstrual dysphoric disorder, generalized anxiety disorder, and PTSD who presents to [facility] via virtual video visit for follow-up and medication management.  Patient is currently being managed on the following medication: Viibryd 20 mg daily. ? ?Patient reports that things have been going well for her.  Patient reports that she has started counseling for grief management.  Patient reports that her mood has been okay but she occasionally experiences feeling hysterical, elevated anxiety, and irritability.  Patient reports that she is doing her best to stay in the moment.  Patient states that she would like to be more levelheaded and  hopes that counseling will help encourage that behavior. ? ?Patient endorses occasional depressive episodes stating that she has heavy sadness in her heart.  Patient continues to endorse a lot of anxiety characterized by excessive worrying, over thinking, and thinking of a variety of scenarios in her life.  Patient's main stressor involves her current stressful work environment.  A GAD-7 screen was performed with the patient scoring a 20. ? ?Patient is alert and oriented x4, calm, cooperative and fully engaged in conversation during the encounter.  Patient endorses good mood plans on going out with a friend today.  Patient denies suicidal or homicidal ideations.  Patient occasionally endorses auditory and visual hallucinations.  She states that she often hears her name being called when no one is around and occasionally witnesses things in her peripheral vision.  Patient endorses good sleep and occasionally naps during the day.  Patient endorses decreased appetite and eats on average 1 meal and a snack per day.  Patient denies alcohol or tobacco use, and illicit drug use. ? ?Visit Diagnosis:  ?  ICD-10-CM   ?1. Bipolar affective disorder, currently depressed, moderate (HCC)  F31.32 VIIBRYD 20 MG TABS  ?  ?2. PMDD (premenstrual dysphoric disorder)  F32.81 VIIBRYD 20 MG TABS  ?  ?3. Generalized anxiety disorder  F41.1 VIIBRYD 20 MG TABS  ?  ? ? ?Past Psychiatric History:  ?Bipolar depression ?PTSD ?Generalized anxiety disorder ?Premenstrual dysphoric disorder ? ?Past Medical History:  ?Past Medical History:  ?Diagnosis Date  ? Abnormal uterine bleeding (AUB)   ? Allergy   ? Asthma   ? Obesity   ? PTSD (post-traumatic stress disorder)   ?  Schizo-affective psychosis (HCC)   ? Sprains and strains of ankle and foot 12/15/2011  ? Right side; occurred on Wednesday 12/12/2011  ? Urinary tract infection   ?  ?Past Surgical History:  ?Procedure Laterality Date  ? WISDOM TOOTH EXTRACTION    ? ? ?Family Psychiatric History:   ?Psychiatric history not documented ? ?Family History:  ?Family History  ?Problem Relation Age of Onset  ? Diabetes Mother   ? Diabetes type II Mother   ? Fibromyalgia Mother   ? Hypertension Mother   ? Clotting disorder Mother   ?     pt states mom is prone to blood clots. unsure if has actual d/o  ? Mental illness Father   ? Mental illness Sister   ? ? ?Social History:  ?Social History  ? ?Socioeconomic History  ? Marital status: Single  ?  Spouse name: Not on file  ? Number of children: Not on file  ? Years of education: Not on file  ? Highest education level: Not on file  ?Occupational History  ? Not on file  ?Tobacco Use  ? Smoking status: Never  ? Smokeless tobacco: Never  ?Vaping Use  ? Vaping Use: Never used  ?Substance and Sexual Activity  ? Alcohol use: No  ? Drug use: No  ? Sexual activity: Never  ?  Birth control/protection: None  ?Other Topics Concern  ? Not on file  ?Social History Narrative  ? Not on file  ? ?Social Determinants of Health  ? ?Financial Resource Strain: Not on file  ?Food Insecurity: Not on file  ?Transportation Needs: Not on file  ?Physical Activity: Not on file  ?Stress: Not on file  ?Social Connections: Not on file  ? ? ?Allergies:  ?Allergies  ?Allergen Reactions  ? Aripiprazole Other (See Comments)  ?  Weight gain  ? Grass Extracts [Gramineae Pollens]   ? Peanut (Diagnostic)   ? Risperidone Other (See Comments)  ?  lactation  ? Soy Allergy   ? Seroquel [Quetiapine Fumerate] Rash  ? ? ?Metabolic Disorder Labs: ?Lab Results  ?Component Value Date  ? HGBA1C 5.7 (H) 07/28/2019  ? MPG 128 (H) 04/22/2011  ? MPG 129 09/25/2007  ? ?No results found for: PROLACTIN ?Lab Results  ?Component Value Date  ? CHOL 179 (H) 04/22/2011  ? TRIG 36 04/22/2011  ? HDL 58 04/22/2011  ? CHOLHDL 3.1 04/22/2011  ? VLDL 7 04/22/2011  ? LDLCALC 114 (H) 04/22/2011  ? LDLCALC  09/25/2007  ?  76        ?Total Cholesterol/HDL:CHD Risk ?Coronary Heart Disease Risk Table ?                    Men   Women ? 1/2  Average Risk   3.4   3.3  ? ?Lab Results  ?Component Value Date  ? TSH 0.934 04/22/2019  ? TSH 1.340 10/31/2018  ? ? ?Therapeutic Level Labs: ?No results found for: LITHIUM ?No results found for: VALPROATE ?No components found for:  CBMZ ? ?Current Medications: ?Current Outpatient Medications  ?Medication Sig Dispense Refill  ? acetaminophen (TYLENOL) 500 MG tablet Take 1 tablet (500 mg total) by mouth every 6 (six) hours as needed. 30 tablet 0  ? albuterol (ACCUNEB) 0.63 MG/3ML nebulizer solution Take 1 ampule by nebulization every 6 (six) hours as needed for wheezing or shortness of breath.     ? cetirizine (ZYRTEC) 10 MG tablet TAKE 1 TABLET BY MOUTH EVERYDAY AT BEDTIME 90 tablet  2  ? Cyanocobalamin (VITAMIN B-12) 1000 MCG SUBL Place under the tongue.    ? fluticasone (FLONASE) 50 MCG/ACT nasal spray Place 2 sprays into both nostrils daily. 16 g 6  ? ibuprofen (ADVIL) 600 MG tablet Take 1 tablet (600 mg total) by mouth every 6 (six) hours as needed for headache or cramping. 60 tablet 0  ? meclizine (ANTIVERT) 25 MG tablet Take 1 tablet (25 mg total) by mouth 3 (three) times daily as needed for dizziness. (Patient not taking: Reported on 04/22/2019) 30 tablet 0  ? Mefenamic Acid 250 MG CAPS 500mg  po at start of period symptoms and then 250mg  po q6h for up to 3 days (Patient not taking: Reported on 03/26/2019) 39 capsule 3  ? norethindrone (AYGESTIN) 5 MG tablet Take 2 tablets (10 mg total) by mouth in the morning and at bedtime. 60 tablet 3  ? Pseudoephedrine-Acetaminophen (SUDAFED SINUS PO) Take by mouth.    ? VIIBRYD 20 MG TABS Take 1 tablet (20 mg total) by mouth daily. Once daily at bedtime. 30 tablet 2  ? Vitamin D, Ergocalciferol, (DRISDOL) 1.25 MG (50000 UNIT) CAPS capsule TAKE 1 CAPSULE BY MOUTH EVERY 7 DAYS 12 capsule 1  ? ?No current facility-administered medications for this visit.  ? ? ? ?Musculoskeletal: ?Strength & Muscle Tone: within normal limits ?Gait & Station: normal ?Patient leans:  N/A ? ?Psychiatric Specialty Exam: ?Review of Systems  ?Psychiatric/Behavioral:  Negative for decreased concentration, dysphoric mood, hallucinations, self-injury, sleep disturbance and suicidal ideas. The patient is nervous/a

## 2021-10-07 IMAGING — DX DG CHEST 2V
2 series · 2 of 2 positions shown · non-contrast
Comparison: None.

CLINICAL DATA: Chest pain

EXAM:
CHEST - 2 VIEW

[chest pa]
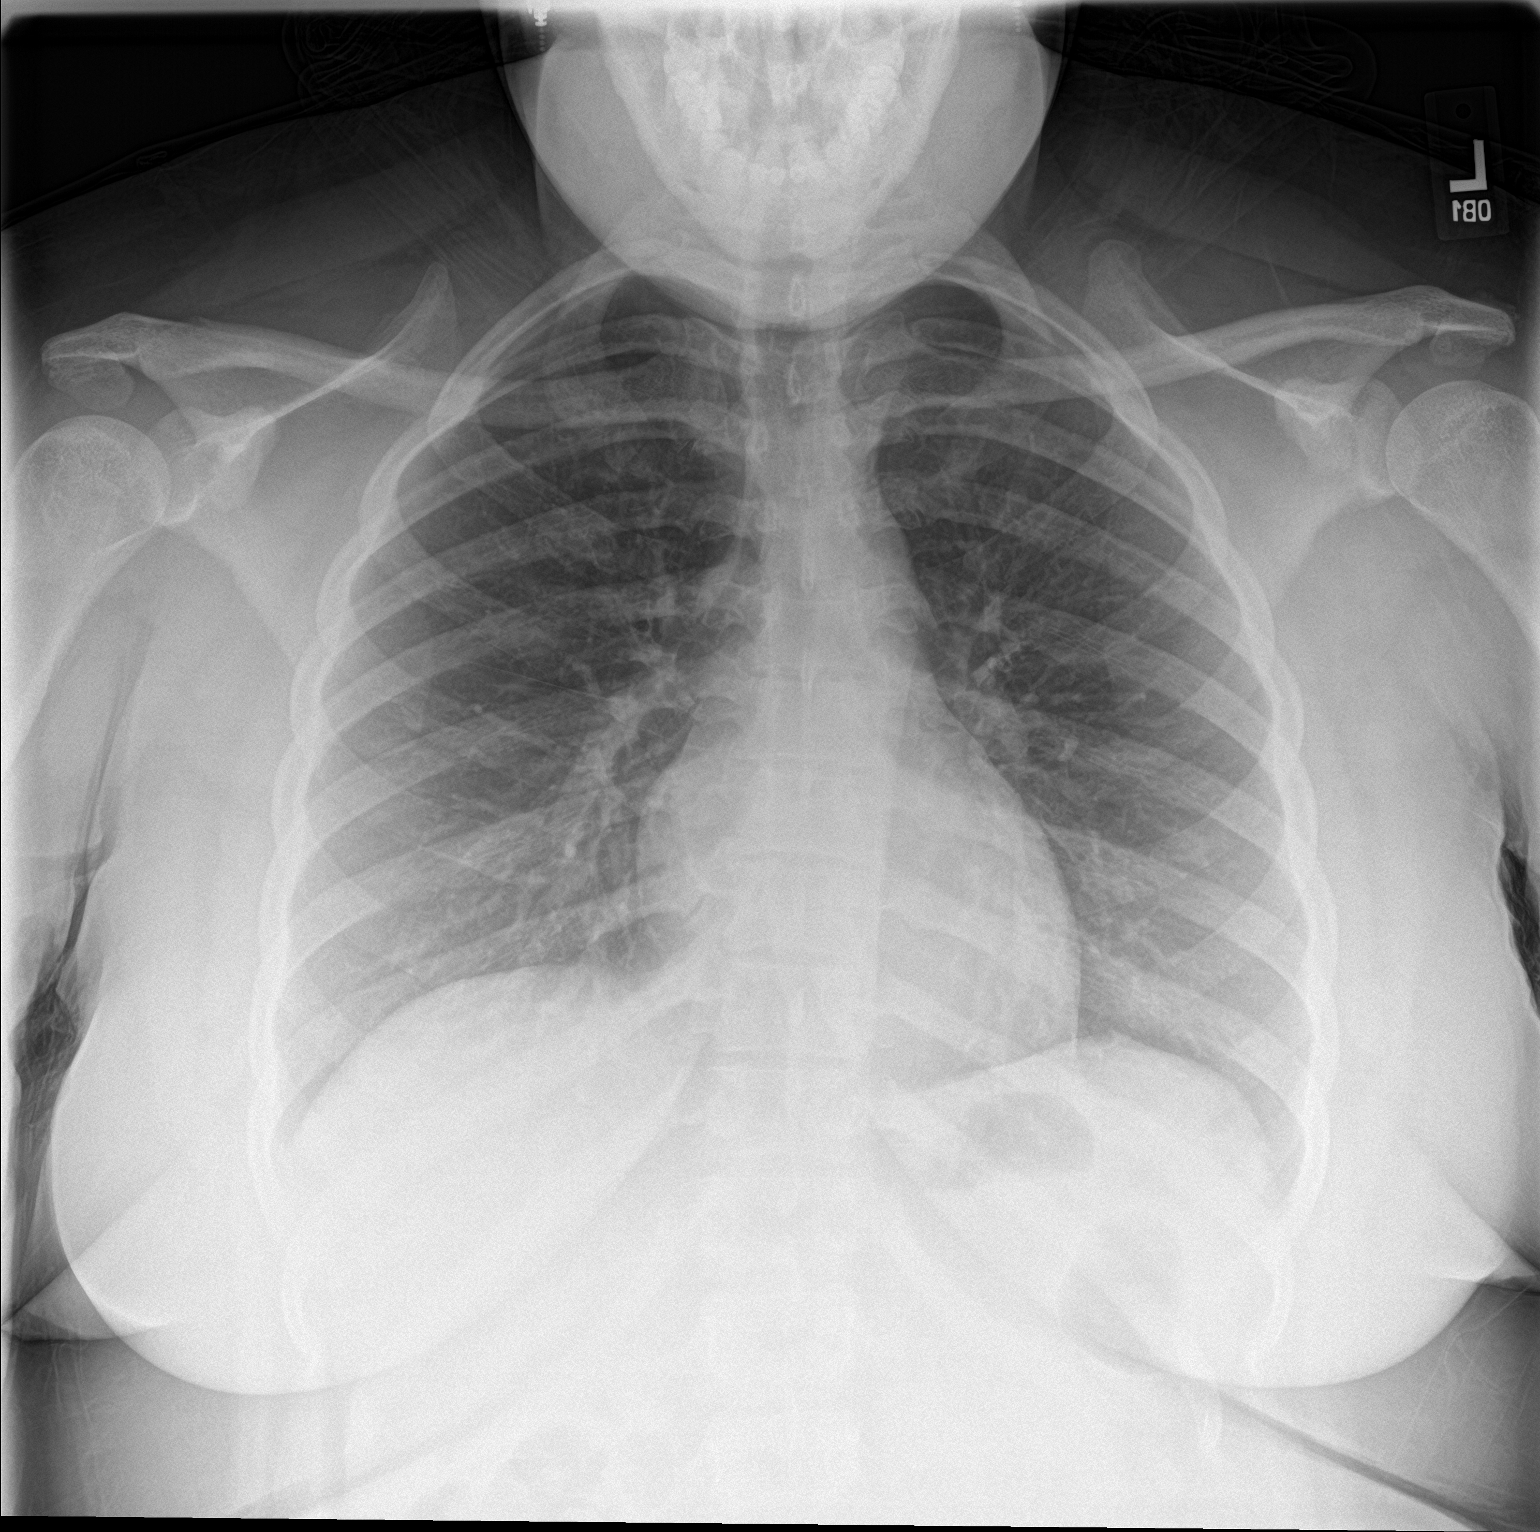

[chest lat]
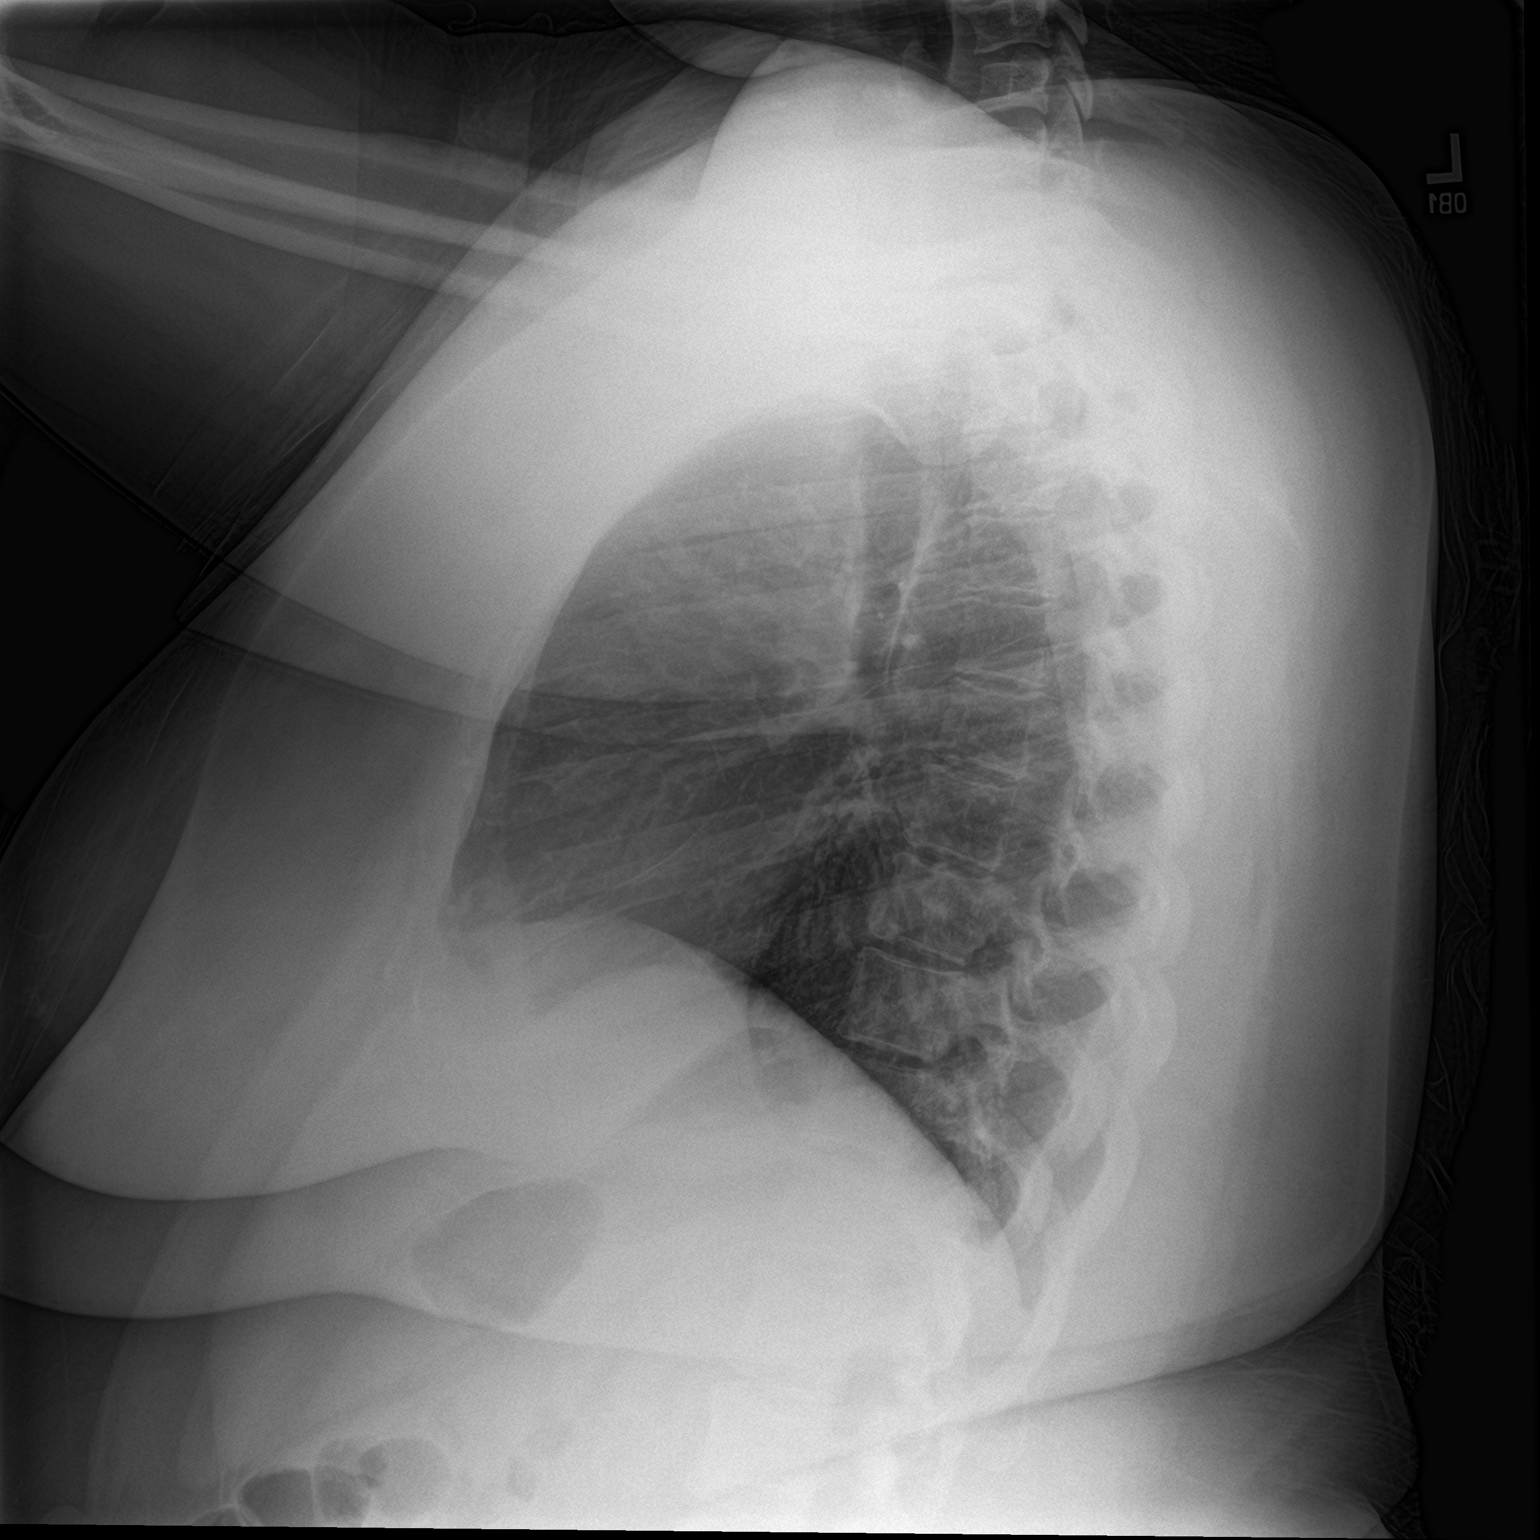

[2 of 2 positions shown; findings below may reference images not displayed]

FINDINGS: The heart size and mediastinal contours are within normal limits.
Both lungs are clear. The visualized skeletal structures are
unremarkable.
IMPRESSION: No active cardiopulmonary disease.

## 2021-10-20 ENCOUNTER — Encounter: Payer: Self-pay | Admitting: Hematology

## 2021-10-25 ENCOUNTER — Telehealth (HOSPITAL_COMMUNITY): Payer: Self-pay | Admitting: *Deleted

## 2021-10-25 NOTE — Telephone Encounter (Signed)
PA requested from patients pharmacy for her Viibryd 20 mg. PA authorized and PA# [id number] and its effective till 10/19/22. Pharmacy notified.

## 2021-10-27 ENCOUNTER — Encounter (HOSPITAL_COMMUNITY): Payer: Self-pay

## 2021-10-27 ENCOUNTER — Telehealth (HOSPITAL_COMMUNITY): Payer: Medicaid Other | Admitting: Student in an Organized Health Care Education/Training Program

## 2021-10-27 DIAGNOSIS — F411 Generalized anxiety disorder: Secondary | ICD-10-CM

## 2021-10-27 DIAGNOSIS — F3281 Premenstrual dysphoric disorder: Secondary | ICD-10-CM

## 2021-10-27 DIAGNOSIS — F3132 Bipolar disorder, current episode depressed, moderate: Secondary | ICD-10-CM

## 2021-10-27 NOTE — Progress Notes (Signed)
Virtual Visit via Video Note  I attempted to connect with Tricia Scott on 10/27/21 at 10:30 AM EDT by a video enabled telemedicine application, however, she did not attend the appointment.  We will reschedule the appointment.    Lauro Franklin, MD

## 2021-11-14 ENCOUNTER — Telehealth (INDEPENDENT_AMBULATORY_CARE_PROVIDER_SITE_OTHER): Payer: Medicaid Other | Admitting: Student in an Organized Health Care Education/Training Program

## 2021-11-14 DIAGNOSIS — F3132 Bipolar disorder, current episode depressed, moderate: Secondary | ICD-10-CM | POA: Diagnosis not present

## 2021-11-14 DIAGNOSIS — F3281 Premenstrual dysphoric disorder: Secondary | ICD-10-CM | POA: Diagnosis not present

## 2021-11-14 DIAGNOSIS — F411 Generalized anxiety disorder: Secondary | ICD-10-CM | POA: Diagnosis not present

## 2021-11-14 MED ORDER — VIIBRYD 20 MG PO TABS: 1.0000 | ORAL_TABLET | Freq: Every day | ORAL | 2 refills | Status: DC

## 2021-11-14 NOTE — Progress Notes (Signed)
Virtual Visit via Video Note  I connected with Tricia Scott on 11/14/21 at  3:30 PM EDT by a video enabled telemedicine application and verified that I am speaking with the correct person using two identifiers.  Location: Patient: Home Provider: [facility]   I discussed the limitations of evaluation and management by telemedicine and the availability of in person appointments. The patient expressed understanding and agreed to proceed.  History of Present Illness:  Tricia Scott is a 25 year old female with a past psychiatric history significant for bipolar disorder, premenstrual dysphoric disorder, generalized anxiety disorder, and PTSD who presents to [facility] via virtual video visit for follow-up and medication management.  Patient is currently being managed on the following medication: Viibryd 20 mg daily.   She reports that she has had significant improvements since her last appointment.  She states that she did go through grief counseling and this was very helpful for her.  She states she recently got back from a trip to the [place] and she had a very good and relaxing time.  She reports that her anxiety has been significantly improved.  She reports her mood has been stable.  She reports that she is currently searching for a new full-time job and is awaiting a call back either today or tomorrow.  She reports she has been picking up extra shifts at her part-time job and is still able to cover the bills so she is not having any additional stress from this.  She reports no side effects to her Viibryd.  She reports no SI, HI, or AVH.  She reports her sleep is good.  She reports her appetite is doing good.  She brings up the concern about making a medication change.  She states that if her job search does take a while she may lose insurance and so would be unable to afford the Viibryd.  Discussed with her that if this were the case she would only need to  call the office and we can make an appointment to discuss medication changes with her and she was reassured by this.  She reports no other concerns at present.   Observations/Objective:  Psychiatric Specialty Exam:   Review of Systems  Respiratory:  Negative for shortness of breath.   Cardiovascular:  Negative for chest pain.  Gastrointestinal:  Negative for abdominal pain, constipation, diarrhea, nausea and vomiting.  Neurological:  Negative for dizziness, weakness and headaches.  Psychiatric/Behavioral:  Negative for dysphoric mood, hallucinations, sleep disturbance and suicidal ideas. The patient is not nervous/anxious.     There were no vitals taken for this visit.There is no height or weight on file to calculate BMI.  General Appearance: Casual and Fairly Groomed  Eye Contact:  Good  Speech:  Clear and Coherent and Normal Rate  Volume:  Normal  Mood:  Euthymic  Affect:  Appropriate and Congruent  Thought Process:  Coherent and Goal Directed  Orientation:  Full (Time, Place, and Person)  Thought Content:  Logical  Suicidal Thoughts:  No  Homicidal Thoughts:  No  Memory:  Immediate;   Good Recent;   Good  Judgement:  Good  Insight:  Good  Psychomotor Activity:  Normal  Concentration:  Concentration: Good and Attention Span: Good  Recall:  Good  Fund of Knowledge:  Good  Language:  Good  Akathisia:  Negative  Handed:  Right  AIMS (if indicated):   Not Done  Assets:  Communication Skills Desire for Improvement Housing Resilience  Social Support  ADL's:  Intact  Cognition:  WNL  Sleep:   Good     Assessment and Plan:  Tricia Scott is a 26 year old female with a past psychiatric history significant for bipolar disorder, premenstrual dysphoric disorder, generalized anxiety disorder, and PTSD who presents to [facility] via virtual video visit for follow-up and medication management.  Patient is currently being managed on the  following medication: Viibryd 20 mg daily.   Tricia Scott is doing well with her medication.  She reports that her mood has remained stable and her anxiety has significantly decreased since her last appointment.  We will not make any medication changes at this time.  Since she is changing jobs she may lose insurance coverage and so the need to make medication changes may be necessary in the future and so she will call to make an appointment sooner if this is the case but otherwise she will follow-up in 3 months.    1. Bipolar affective disorder, currently depressed, moderate (HCC)  - VIIBRYD 20 MG TABS; Take 1 tablet (20 mg total) by mouth daily. Once daily at bedtime.  Dispense: 30 tablet; Refill: 2    2. PMDD (premenstrual dysphoric disorder) - VIIBRYD 20 MG TABS; Take 1 tablet (20 mg total) by mouth daily. Once daily at bedtime.  Dispense: 30 tablet; Refill: 2    3. Generalized anxiety disorder  - VIIBRYD 20 MG TABS; Take 1 tablet (20 mg total) by mouth daily. Once daily at bedtime.  Dispense: 30 tablet; Refill: 2    Follow Up Instructions: Follow up in 3 months   I discussed the assessment and treatment plan with the patient. The patient was provided an opportunity to ask questions and all were answered. The patient agreed with the plan and demonstrated an understanding of the instructions.   The patient was advised to call back or seek an in-person evaluation if the symptoms worsen or if the condition fails to improve as anticipated.  I provided 11 minutes of non-face-to-face time during this encounter.   Lauro Franklin, MD

## 2022-02-20 ENCOUNTER — Telehealth (HOSPITAL_COMMUNITY): Payer: Medicaid Other | Admitting: Physician Assistant

## 2022-02-22 ENCOUNTER — Encounter (HOSPITAL_COMMUNITY): Payer: Self-pay | Admitting: Student in an Organized Health Care Education/Training Program

## 2022-02-22 ENCOUNTER — Telehealth (INDEPENDENT_AMBULATORY_CARE_PROVIDER_SITE_OTHER): Payer: Medicaid Other | Admitting: Student in an Organized Health Care Education/Training Program

## 2022-02-22 DIAGNOSIS — F3281 Premenstrual dysphoric disorder: Secondary | ICD-10-CM | POA: Diagnosis not present

## 2022-02-22 DIAGNOSIS — F3132 Bipolar disorder, current episode depressed, moderate: Secondary | ICD-10-CM

## 2022-02-22 DIAGNOSIS — F411 Generalized anxiety disorder: Secondary | ICD-10-CM

## 2022-02-22 MED ORDER — VILAZODONE HCL 40 MG PO TABS: 40.0000 mg | ORAL_TABLET | Freq: Every day | ORAL | 2 refills | Status: AC
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
## 2022-02-23 ENCOUNTER — Telehealth (HOSPITAL_COMMUNITY): Payer: Self-pay | Admitting: *Deleted

## 2022-02-23 MED ORDER — VILAZODONE HCL 40 MG PO TABS: 40.0000 mg | ORAL_TABLET | Freq: Every day | ORAL | 0 refills | Status: AC

## 2022-02-23 NOTE — Telephone Encounter (Signed)
Called patient and expressed that patient does need 40mg  dose and although hesitant due to the change and confusion, patient endorsed she will come get the samples. Patient endorsed that she will go to work despite having a rough night. Again provider reiterated that patient's depression was not adequately treated on 20mg  hence the increase.

## 2022-02-23 NOTE — Telephone Encounter (Signed)
Called patient to tell her the PA for her Viibyrd has been done this am but [employer] can take up to 24 hours to respond. She is in tears, states has been one day without medicine and is a "mess" "I have a lot going on" I have one #30 bottle of Viibyrd 40 mg I can have her pick up today. I will forward this info to Eliseo Gum MD. She also feels unsure if she can go to work today and I agreed to write her a work excuse for today only. She will pick both up today before 4.

## 2022-02-23 NOTE — Telephone Encounter (Signed)
PA request for patients Vilazodone HCL. I had authorized it in 07/23 for a year. I called the pharmacy and it has to be done again due to her recent dose change to 40 mg daily. Submitted via phone with [employer], 24 to review for a determination. PA # [id number]

## 2022-03-02 ENCOUNTER — Telehealth (HOSPITAL_COMMUNITY): Payer: Self-pay | Admitting: Student in an Organized Health Care Education/Training Program

## 2022-03-02 ENCOUNTER — Telehealth (HOSPITAL_COMMUNITY): Payer: Self-pay | Admitting: *Deleted

## 2022-03-02 NOTE — Telephone Encounter (Signed)
Communicated to the patient, that provider and staff have attempted multiple times today to appeal reported denial the patient has received in the mail today.  Unfortunately no one was able to speak with an agent at [facility] to start appeal process.  Patient was notified, that she should continue to take samples that she has for now and office will continue the upcoming week to work on patient's case.  Patient endorsed understanding and appreciation.  PGY-3 Eliseo Gum, MD

## 2022-03-02 NOTE — Telephone Encounter (Signed)
PATIENT CALLED STATED SHE RECEIVED A DENIAL LETTER FROM HER INSURANCE COMPANY FOR MEDICATION -- Vilazodone HCl (VIIBRYD) 40 MG TABS. PATIENT REQUESTED CALL SHE WOULD LIKE TO KNOW  WHAT IS SHE SUPPOSE TO DO NOW?? PREFERS NOT TO WAIT UNTIL NEXT APPT ON  04/26/22 TO DISCUSS

## 2022-03-22 ENCOUNTER — Telehealth (HOSPITAL_COMMUNITY): Payer: Self-pay | Admitting: Student in an Organized Health Care Education/Training Program

## 2022-03-22 ENCOUNTER — Other Ambulatory Visit (HOSPITAL_COMMUNITY): Payer: Self-pay | Admitting: Student in an Organized Health Care Education/Training Program

## 2022-03-22 DIAGNOSIS — F3281 Premenstrual dysphoric disorder: Secondary | ICD-10-CM

## 2022-03-22 DIAGNOSIS — F3132 Bipolar disorder, current episode depressed, moderate: Secondary | ICD-10-CM

## 2022-03-22 DIAGNOSIS — F411 Generalized anxiety disorder: Secondary | ICD-10-CM

## 2022-03-22 NOTE — Telephone Encounter (Signed)
Spoke with pharmacy, they ran the medication and said her insurance will now pay for the brand name, not the generic, but will prepare for her.

## 2022-03-22 NOTE — Telephone Encounter (Signed)
Patient left a voicemail asking if the issue with her insurance denying her refill has been resolved. She said she is running low on her medication.  Patients # [phone]

## 2022-04-24 ENCOUNTER — Telehealth (HOSPITAL_COMMUNITY): Payer: Self-pay | Admitting: Student in an Organized Health Care Education/Training Program

## 2022-04-24 NOTE — Telephone Encounter (Signed)
Spoke to patient she is needing a PA as she already has refills. Thanks!

## 2022-04-26 ENCOUNTER — Encounter (HOSPITAL_COMMUNITY): Payer: Self-pay | Admitting: Student in an Organized Health Care Education/Training Program

## 2022-04-26 ENCOUNTER — Telehealth (INDEPENDENT_AMBULATORY_CARE_PROVIDER_SITE_OTHER): Payer: Medicaid Other | Admitting: Student in an Organized Health Care Education/Training Program

## 2022-04-26 ENCOUNTER — Ambulatory Visit (HOSPITAL_COMMUNITY)
Admission: EM | Admit: 2022-04-26 | Discharge: 2022-04-26 | Disposition: A | Discharge status: Home / Self Care | Payer: Medicaid Other | Attending: Psychiatry | Admitting: Psychiatry

## 2022-04-26 DIAGNOSIS — F13939 Sedative, hypnotic or anxiolytic use, unspecified with withdrawal, unspecified: Secondary | ICD-10-CM | POA: Diagnosis not present

## 2022-04-26 DIAGNOSIS — F19939 Other psychoactive substance use, unspecified with withdrawal, unspecified: Secondary | ICD-10-CM | POA: Insufficient documentation

## 2022-04-26 DIAGNOSIS — L298 Other pruritus: Secondary | ICD-10-CM | POA: Insufficient documentation

## 2022-04-26 DIAGNOSIS — R454 Irritability and anger: Secondary | ICD-10-CM | POA: Insufficient documentation

## 2022-04-26 DIAGNOSIS — R45851 Suicidal ideations: Secondary | ICD-10-CM

## 2022-04-26 DIAGNOSIS — T43205A Adverse effect of unspecified antidepressants, initial encounter: Secondary | ICD-10-CM | POA: Insufficient documentation

## 2022-04-26 NOTE — Telephone Encounter (Signed)
During patient visit, she endorsed that different pharmacy's were telling her different things and that eventually she was told that no one in [place] had the medication and this was why she could not get a refill
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
## 2022-04-26 NOTE — ED Provider Notes (Signed)
Behavioral Health Urgent Care Medical Screening Exam  Patient Name: Tricia Scott MRN: [id number] Date of Evaluation: 04/26/22 Chief Complaint: SSRI discontinuation syndrome Diagnosis:  Final diagnoses:  Antidepressant discontinuation syndrome, initial encounter    History of Present illness: Tricia Scott is a 27 y.o. female with past psychiatric history significant for bipolar disorder, premenstrual dysphoric disorder, generalized anxiety disorder, and PTSD who presents to [facility] (04/26/2022) voluntary as a walk-in for antidepressant discontinuation syndrome  Patient was recommended to be evaluated at the health by outpatient psychiatrist Damita Dunnings, MD) for concern of possible severe discontinuation syndrome.  Patient was originally had a follow-up appointment with her outpatient psychiatrist today. Patient reported the last time she took Viibryd was on Monday, since then she has had worsening symptoms of generalized pruritus, rash on chest, decreased appetite, and irritability.  This is not the first time that she has experienced these withdrawal symptoms.  The last time this occurred, the worst of her symptoms was a headache where she presented to the ED and they treated her with fluids and kept overnight and discharged next day.  Patient denied having headaches at this time.    Patient denied SI/HI/AVH, paranoia, and contracted to safety.  Patient understood that since her vital signs are stable, and that she is not having SI/HI/AVH, paranoia, that she does not meet criteria for [place].  In regards to the withdrawal symptoms, she was amenable to returning home and treating it conservatively.  She was encouraged to keep up p.o. fluid and food as tolerated. Patient was amenable to this plan and had no safety concerns or other questions.    [place] [facility] from 04/26/2022 in [facility] Video Visit from 07/28/2021 in [facility] Video Visit from 04/28/2021 in [place] No Risk No Risk No Risk       Psychiatric Specialty Exam  Presentation  General Appearance:Appropriate for Environment; Casual; Fairly Groomed  Eye Contact:Good  Speech:Clear and Coherent; Normal Rate  Speech Volume:Normal  Handedness:Right   Mood and Affect  Mood: Dysphoric  Affect: Appropriate; Congruent; Constricted   Thought Process  Thought Processes: Coherent; Goal Directed; Linear  Descriptions of Associations:Intact  Orientation:Full (Time, Place and Person)  Thought Content:Logical; WDL    Hallucinations:None  Ideas of Reference:None  Suicidal Thoughts:No  Homicidal Thoughts:No   Sensorium  Memory: Immediate Good  Judgment: Fair  Insight: Fair   Executive Functions  Concentration: Good  Attention Span: Good  Recall: Good  Fund of Knowledge: Good  Language: Good   Psychomotor Activity  Psychomotor Activity: Normal   Assets  Assets: Communication Skills; Desire for Improvement   Sleep  Sleep: Good  Number of hours: No data recorded  No data recorded  Physical Exam: Physical Exam Vitals and nursing note reviewed.  Constitutional:      General: She is not in acute distress.    Appearance: She is not ill-appearing, toxic-appearing or diaphoretic.  HENT:     Head: Normocephalic and atraumatic.  Pulmonary:     Effort: Pulmonary effort is normal. No respiratory distress.  Neurological:     General: No focal deficit present.     Mental Status: She is alert and oriented to person, place, and time.    Review of Systems  Constitutional:  Positive for malaise/fatigue. Negative for chills, diaphoresis and fever.  Respiratory:  Negative for shortness of breath.   Cardiovascular:  Negative for chest pain  and palpitations.  Gastrointestinal:  Positive for abdominal pain. Negative for constipation, diarrhea,  nausea and vomiting.  Skin:  Positive for itching and rash.  Neurological:  Negative for dizziness, tremors and headaches.   Blood pressure 127/88, pulse 88, temperature 98.4 F (36.9 C), temperature source Oral, resp. rate 18, SpO2 100 %. There is no height or weight on file to calculate BMI.  Musculoskeletal: Strength & Muscle Tone: within normal limits Gait & Station: normal Patient leans: N/A    [place] [facility] Discharge Disposition for Follow up and Recommendations: Based on my evaluation the patient does not appear to have an emergency medical condition and can be discharged with resources and follow up care in outpatient services for Medication Management  Merrily Brittle, DO 04/26/2022, 11:45 AM

## 2022-04-26 NOTE — ED Notes (Signed)
Patient A&O x 4, ambulatory. Patient discharged in no acute distress. Patient denied SI/HI, A/VH upon discharge. Patient verbalized understanding of all discharge instructions explained by staff, to include follow up appointments, and safety plan. Patient reported mood 10/10.  Pt belongings returned to patient from black locker intact. Patient escorted to lobby via staff for transport to destination. Safety maintained.  

## 2022-04-26 NOTE — Discharge Instructions (Addendum)
Dear Tricia Scott,  Most effective treatment for your mental health disease involves BOTH a psychiatrist AND a therapist Psychiatrist to manage medications Therapist to help identify personal goals, barriers from those goals, and plan to achieve those goals by understanding emotions Please make regular appointments with an outpatient psychiatrist and other doctors once you leave the hospital (if any, otherwise, please see below for resources to make an appointment).  For therapy outside the hospital, please ask for these specific types of therapy: DBT ________________________________________________________  SAFETY CRISIS  Dial 988 for [place]    Text [phone] for Crisis Text Line:     [facility]:  [address] FIRST FLOOR.  [place], [place] [address].  [phone]  Mobile Crisis Response Teams Listed by counties in vicinity of [place]. [phone] [place] [phone] [place] [phone] [facility] [phone] [place] [phone] [place]. [phone] [place].  [place] [phone] ________________________________________________________  To see which pharmacy near you is the CHEAPEST for certain medications, please use GoodRx. It is free website and has a free phone app.    Also consider looking at [facility] $4.00 or [facility]'s $7.00 prescription list. Both are free to view if googled "[facility] $4 prescription" and "public's $7 prescription". These are set prices, no insurance required. [facility] low cost medications: $4-$15 for 30days  prescriptions or $10-$38 for 90days prescriptions  ________________________________________________________  Difficulties with sleep?   Can also use this free app for insomnia called CBT-I. Let your doctors and therapists know so they can help with extra tips and tricks or for guidance and accountability. NO ADDS on the app.     ________________________________________________________  Non-Emergent / Urgent  [facility] [address], [place], [place] [address] [phone] OUTPATIENT Walk-in information: Please note, all walk-ins are first come & first serve, with limited number of availability.  Please note that to be eligible for services you must bring: ID or a piece of mail with your name [facility] address  Therapist for therapy:  Monday & Wednesdays: Please ARRIVE at 7:15 AM for registration Will START at 8:00 AM Every 1st & 2nd Friday of the month: Please ARRIVE at 10:15 AM for registration Will START at 1 PM - 5 PM  Psychiatrist for medication management: Monday - Friday:  Please ARRIVE at 7:15 AM for registration Will START at 8:00 AM  Regretfully, due to limited availability, please be aware that you may not been seen on the same day as walk-in. Please consider making an appoint or try again. Thank you for your patience and understanding.

## 2022-06-19 ENCOUNTER — Encounter: Payer: Self-pay | Admitting: Hematology

## 2022-06-26 ENCOUNTER — Telehealth (INDEPENDENT_AMBULATORY_CARE_PROVIDER_SITE_OTHER): Payer: Medicaid Other | Admitting: Physician Assistant

## 2022-06-26 ENCOUNTER — Encounter (HOSPITAL_COMMUNITY): Payer: Self-pay | Admitting: Physician Assistant

## 2022-06-26 DIAGNOSIS — F411 Generalized anxiety disorder: Secondary | ICD-10-CM | POA: Diagnosis not present

## 2022-06-26 DIAGNOSIS — F3281 Premenstrual dysphoric disorder: Secondary | ICD-10-CM

## 2022-06-26 DIAGNOSIS — F4310 Post-traumatic stress disorder, unspecified: Secondary | ICD-10-CM

## 2022-06-26 DIAGNOSIS — F330 Major depressive disorder, recurrent, mild: Secondary | ICD-10-CM | POA: Insufficient documentation

## 2022-06-26 NOTE — Progress Notes (Unsigned)
[place] MD/PA/NP OP Progress Note  Virtual Visit via Video Note  I connected with Tricia Scott on 06/26/22 at  9:30 AM EDT by a video enabled telemedicine application and verified that I am speaking with the correct person using two identifiers.  Location: Patient: Home Provider: Clinic   I discussed the limitations of evaluation and management by telemedicine and the availability of in person appointments. The patient expressed understanding and agreed to proceed.  Follow Up Instructions:  I discussed the assessment and treatment plan with the patient. The patient was provided an opportunity to ask questions and all were answered. The patient agreed with the plan and demonstrated an understanding of the instructions.   The patient was advised to call back or seek an in-person evaluation if the symptoms worsen or if the condition fails to improve as anticipated.  I provided 16 minutes of non-face-to-face time during this encounter.  Malachy Mood, PA    06/26/2022 4:42 PM SHAQUAYA KNISLEY  MRN:  [id number]  Chief Complaint:  Chief Complaint  Patient presents with   Follow-up   HPI: ***  Tricia Scott  Visit Diagnosis:    ICD-10-CM   1. Generalized anxiety disorder  F41.1     2. PTSD (post-traumatic stress disorder)  F43.10     3. PMDD (premenstrual dysphoric disorder)  F32.81     4. Mild episode of recurrent major depressive disorder (HCC)  F33.0       Past Psychiatric History:  PTSD Generalized anxiety disorder Premenstrual dysphoric disorder Major depressive disorder vs low concern for bipolar disorder  Past Medical History:  Past Medical History:  Diagnosis Date   Abnormal uterine bleeding (AUB)    Allergy    Asthma    Obesity    PTSD (post-traumatic stress disorder)    Schizo-affective psychosis ([place])    Sprains and strains of ankle and foot 12/15/2011   Right side; occurred on Wednesday 12/12/2011   Urinary tract infection     Past Surgical  History:  Procedure Laterality Date   WISDOM TOOTH EXTRACTION      Family Psychiatric History:  Family psychiatric history not documented  Family History:  Family History  Problem Relation Age of Onset   Diabetes Mother    Diabetes type II Mother    Fibromyalgia Mother    Hypertension Mother    Clotting disorder Mother        pt states mom is prone to blood clots. unsure if has actual d/o   Mental illness Father    Mental illness Sister     Social History:  Social History   Socioeconomic History   Marital status: Single    Spouse name: Not on file   Number of children: Not on file   Years of education: Not on file   Highest education level: Not on file  Occupational History   Not on file  Tobacco Use   Smoking status: Never   Smokeless tobacco: Never  Vaping Use   Vaping Use: Never used  Substance and Sexual Activity   Alcohol use: No   Drug use: No   Sexual activity: Never    Birth control/protection: None  Other Topics Concern   Not on file  Social History Narrative   Not on file   Social Determinants of Health   Financial Resource Strain: Not on file  Food Insecurity: No Food Insecurity (08/17/2019)   Hunger Vital Sign    Worried About Running Out of Food in  the Last Year: Never true    [place] in the Last Year: Never true  Transportation Needs: No Transportation Needs (08/17/2019)   PRAPARE - Hydrologist (Medical): No    Lack of Transportation (Non-Medical): No  Physical Activity: Not on file  Stress: Not on file  Social Connections: Not on file    Allergies:  Allergies  Allergen Reactions   Aripiprazole Other (See Comments)    Weight gain   Grass Extracts [Gramineae Pollens]    Peanut (Diagnostic)    Risperidone Other (See Comments)    lactation   Soy Allergy    Seroquel [Quetiapine Fumerate] Rash    Metabolic Disorder Labs: Lab Results  Component Value Date   HGBA1C 5.7 (H) 07/28/2019   MPG 128 (H)  04/22/2011   MPG 129 09/25/2007   No results found for: "PROLACTIN" Lab Results  Component Value Date   CHOL 179 (H) 04/22/2011   TRIG 36 04/22/2011   HDL 58 04/22/2011   CHOLHDL 3.1 04/22/2011   VLDL 7 04/22/2011   LDLCALC 114 (H) 04/22/2011   LDLCALC  09/25/2007    76        Total Cholesterol/HDL:CHD Risk Coronary Heart Disease Risk Table                     Men   Women  1/2 Average Risk   3.4   3.3   Lab Results  Component Value Date   TSH 0.934 04/22/2019   TSH 1.340 10/31/2018    Therapeutic Level Labs: No results found for: "LITHIUM" No results found for: "VALPROATE" No results found for: "CBMZ"  Current Medications: Current Outpatient Medications  Medication Sig Dispense Refill   acetaminophen (TYLENOL) 500 MG tablet Take 1 tablet (500 mg total) by mouth every 6 (six) hours as needed. 30 tablet 0   albuterol (ACCUNEB) 0.63 MG/3ML nebulizer solution Take 1 ampule by nebulization every 6 (six) hours as needed for wheezing or shortness of breath.      cetirizine (ZYRTEC) 10 MG tablet TAKE 1 TABLET BY MOUTH EVERYDAY AT BEDTIME 90 tablet 2   Cyanocobalamin (VITAMIN B-12) 1000 MCG SUBL Place under the tongue.     fluticasone (FLONASE) 50 MCG/ACT nasal spray Place 2 sprays into both nostrils daily. 16 g 6   ibuprofen (ADVIL) 600 MG tablet Take 1 tablet (600 mg total) by mouth every 6 (six) hours as needed for headache or cramping. 60 tablet 0   meclizine (ANTIVERT) 25 MG tablet Take 1 tablet (25 mg total) by mouth 3 (three) times daily as needed for dizziness. (Patient not taking: Reported on 04/22/2019) 30 tablet 0   Mefenamic Acid 250 MG CAPS '500mg'$  po at start of period symptoms and then '250mg'$  po q6h for up to 3 days (Patient not taking: Reported on 03/26/2019) 39 capsule 3   norethindrone (AYGESTIN) 5 MG tablet Take 2 tablets (10 mg total) by mouth in the morning and at bedtime. 60 tablet 3   Pseudoephedrine-Acetaminophen (SUDAFED SINUS PO) Take by mouth.     Vilazodone  HCl (VIIBRYD) 40 MG TABS Take 1 tablet (40 mg total) by mouth daily. 30 tablet 2   Vilazodone HCl (VIIBRYD) 40 MG TABS Take 1 tablet (40 mg total) by mouth daily. 30 tablet 0   Vitamin D, Ergocalciferol, (DRISDOL) 1.25 MG (50000 UNIT) CAPS capsule TAKE 1 CAPSULE BY MOUTH EVERY 7 DAYS 12 capsule 1   No current facility-administered medications for  this visit.     Musculoskeletal: Strength & Muscle Tone: within normal limits Gait & Station: normal Patient leans: N/A  Psychiatric Specialty Exam: Review of Systems  Psychiatric/Behavioral:  Negative for decreased concentration, dysphoric mood, hallucinations, self-injury, sleep disturbance and suicidal ideas. The patient is not nervous/anxious and is not hyperactive.     There were no vitals taken for this visit.There is no height or weight on file to calculate BMI.  General Appearance: Casual  Eye Contact:  Fair  Speech:  Clear and Coherent and Normal Rate  Volume:  Normal  Mood:  Anxious and Depressed  Affect:  Appropriate  Thought Process:  Coherent and Descriptions of Associations: Intact  Orientation:  Full (Time, Place, and Person)  Thought Content: WDL   Suicidal Thoughts:  No  Homicidal Thoughts:  No  Memory:  Immediate;   Good Recent;   Good Remote;   Good  Judgement:  Good  Insight:  Good  Psychomotor Activity:  Normal  Concentration:  Concentration: Good and Attention Span: Good  Recall:  Good  Fund of Knowledge: Good  Language: Good  Akathisia:  No  Handed:  Right  AIMS (if indicated): not done  Assets:  Communication Skills Desire for Improvement Housing Social Support Transportation Vocational/Educational  ADL's:  Intact  Cognition: WNL  Sleep:  Good   Screenings: GAD-7    Flowsheet Row Video Visit from 06/26/2022 in [facility] Video Visit from 07/28/2021 in [facility] Video Visit from 04/28/2021 in [facility] Video  Visit from 12/30/2020 in [facility] Video Visit from 10/28/2020 in [facility]  Total GAD-7 Score '6 20 9 17 2      '$ PHQ2-9    Flowsheet Row Video Visit from 06/26/2022 in [facility] Video Visit from 07/28/2021 in [facility] Video Visit from 04/28/2021 in [facility] Video Visit from 12/30/2020 in [facility] Video Visit from 10/28/2020 in [facility]  PHQ-2 Total Score 0 1 0 1 0      Flowsheet Row Video Visit from 06/26/2022 in [facility] ED from 04/26/2022 in [facility] Video Visit from 07/28/2021 in [place] No Risk No Risk No Risk        Assessment and Plan: ***    Collaboration of Care: Collaboration of Care: Medication Management AEB provider managing patient's psychiatric medications and Psychiatrist AEB patient being followed by mental health provider at this facility  Patient/Guardian was advised Release of Information must be obtained prior to any record release in order to collaborate their care with an outside provider. Patient/Guardian was advised if they have not already done so to contact the registration department to sign all necessary forms in order for [place] to release information regarding their care.   Consent: Patient/Guardian gives verbal consent for treatment and assignment of benefits for services provided during this visit. Patient/Guardian expressed understanding and agreed to proceed.   1. Generalized anxiety disorder   2. PTSD (post-traumatic stress disorder)   3. PMDD (premenstrual dysphoric disorder)   4. Mild episode of recurrent major depressive disorder [facility])  Patient to follow-up in 6 weeks Provider spent a total of 16 minutes with the  patient/reviewing patient's chart  Malachy Mood, PA 06/26/2022, 4:42 PM

## 2022-08-03 ENCOUNTER — Encounter: Payer: Self-pay | Admitting: Hematology

## 2022-08-10 ENCOUNTER — Telehealth (INDEPENDENT_AMBULATORY_CARE_PROVIDER_SITE_OTHER): Payer: Self-pay | Admitting: Physician Assistant

## 2022-08-10 ENCOUNTER — Encounter (HOSPITAL_COMMUNITY): Payer: Self-pay | Admitting: Physician Assistant

## 2022-08-10 ENCOUNTER — Telehealth (HOSPITAL_COMMUNITY): Payer: Self-pay | Admitting: Physician Assistant

## 2022-08-10 ENCOUNTER — Encounter: Payer: Self-pay | Admitting: Hematology

## 2022-08-10 DIAGNOSIS — F4310 Post-traumatic stress disorder, unspecified: Secondary | ICD-10-CM

## 2022-08-10 DIAGNOSIS — F411 Generalized anxiety disorder: Secondary | ICD-10-CM

## 2022-08-10 DIAGNOSIS — F3281 Premenstrual dysphoric disorder: Secondary | ICD-10-CM

## 2022-08-10 DIAGNOSIS — F330 Major depressive disorder, recurrent, mild: Secondary | ICD-10-CM

## 2022-08-10 NOTE — Progress Notes (Unsigned)
[facility] MD/PA/NP OP Progress Note  Virtual Visit via Video Note  I connected with Tricia Scott on 08/12/22 at  3:30 PM EDT by a video enabled telemedicine application and verified that I am speaking with the correct person using two identifiers.  Location: Patient: Home Provider: Clinic   I discussed the limitations of evaluation and management by telemedicine and the availability of in person appointments. The patient expressed understanding and agreed to proceed.  Follow Up Instructions:  I discussed the assessment and treatment plan with the patient. The patient was provided an opportunity to ask questions and all were answered. The patient agreed with the plan and demonstrated an understanding of the instructions.   The patient was advised to call back or seek an in-person evaluation if the symptoms worsen or if the condition fails to improve as anticipated.  I provided 13 minutes of non-face-to-face time during this encounter.  Meta Hatchet, PA    08/12/2022 3:19 AM KENDAL GHAZARIAN  MRN:  [id number]  Chief Complaint:  Chief Complaint  Patient presents with   Follow-up   HPI:   Tricia Scott is a 27 year old, African-American female with a past psychiatric history significant for major depressive disorder versus bipolar affective disorder (depressed), generalized anxiety disorder, PTSD, and premenstrual dysphoric disorder who presents to [facility] for follow-up and medication management.  Patient was last seen by Bobbye Morton, MD on 04/26/2022.  During her last encounter, patient was not being managed on any psychiatric medications.  Patient reports no major issues or concerns at this time.  Patient endorses some depressive symptoms mostly attributed to grief but denies anything out of the ordinary.  Patient endorses anxiety and rates her anxiety as 6 out of 10.  Patient's current stressors include disliking her current job,  dealing with grief, breaking into being a full-time nanny, and friendship dynamics.  Patient reports no other issues or concerns at this time.  A GAD-7 screen was performed with the patient scoring a 7.  Patient is alert and oriented x 4, calm, cooperative, and fully engaged in conversation during the encounter.  Patient states that she is very tired due to dog sitting at the moment.  Patient denies suicidal or homicidal ideations.  She further denies auditory or visual hallucinations and does not appear to be responding to internal/external stimuli.  Patient endorses good sleep and receives on average 8 hours of sleep each night.  Patient endorses improved appetite and eats on average 2 meals per day.  Patient denies alcohol consumption, tobacco use, and illicit drug use.  Visit Diagnosis:    ICD-10-CM   1. Generalized anxiety disorder  F41.1     2. PTSD (post-traumatic stress disorder)  F43.10     3. PMDD (premenstrual dysphoric disorder)  F32.81     4. Mild episode of recurrent major depressive disorder (HCC)  F33.0       Past Psychiatric History:  PTSD Generalized anxiety disorder Premenstrual dysphoric disorder Major depressive disorder vs low concern for bipolar disorder  Past Medical History:  Past Medical History:  Diagnosis Date   Abnormal uterine bleeding (AUB)    Allergy    Asthma    Obesity    PTSD (post-traumatic stress disorder)    Schizo-affective psychosis (HCC)    Sprains and strains of ankle and foot 12/15/2011   Right side; occurred on Wednesday 12/12/2011   Urinary tract infection     Past Surgical History:  Procedure Laterality  Date   WISDOM TOOTH EXTRACTION      Family Psychiatric History:  Family psychiatric history not documented  Family History:  Family History  Problem Relation Age of Onset   Diabetes Mother    Diabetes type II Mother    Fibromyalgia Mother    Hypertension Mother    Clotting disorder Mother        pt states mom is prone to  blood clots. unsure if has actual d/o   Mental illness Father    Mental illness Sister     Social History:  Social History   Socioeconomic History   Marital status: Single    Spouse name: Not on file   Number of children: Not on file   Years of education: Not on file   Highest education level: Not on file  Occupational History   Not on file  Tobacco Use   Smoking status: Never   Smokeless tobacco: Never  Vaping Use   Vaping Use: Never used  Substance and Sexual Activity   Alcohol use: No   Drug use: No   Sexual activity: Never    Birth control/protection: None  Other Topics Concern   Not on file  Social History Narrative   Not on file   Social Determinants of Health   Financial Resource Strain: Not on file  Food Insecurity: No Food Insecurity (08/17/2019)   Hunger Vital Sign    Worried About Running Out of Food in the Last Year: Never true    Ran Out of Food in the Last Year: Never true  Transportation Needs: No Transportation Needs (08/17/2019)   PRAPARE - Administrator, Civil Service (Medical): No    Lack of Transportation (Non-Medical): No  Physical Activity: Not on file  Stress: Not on file  Social Connections: Not on file    Allergies:  Allergies  Allergen Reactions   Aripiprazole Other (See Comments)    Weight gain   Grass Extracts [Gramineae Pollens]    Peanut (Diagnostic)    Risperidone Other (See Comments)    lactation   Soy Allergy    Seroquel [Quetiapine Fumerate] Rash    Metabolic Disorder Labs: Lab Results  Component Value Date   HGBA1C 5.7 (H) 07/28/2019   MPG 128 (H) 04/22/2011   MPG 129 09/25/2007   No results found for: "PROLACTIN" Lab Results  Component Value Date   CHOL 179 (H) 04/22/2011   TRIG 36 04/22/2011   HDL 58 04/22/2011   CHOLHDL 3.1 04/22/2011   VLDL 7 04/22/2011   LDLCALC 114 (H) 04/22/2011   LDLCALC  09/25/2007    76        Total Cholesterol/HDL:CHD Risk Coronary Heart Disease Risk Table                      Men   Women  1/2 Average Risk   3.4   3.3   Lab Results  Component Value Date   TSH 0.934 04/22/2019   TSH 1.340 10/31/2018    Therapeutic Level Labs: No results found for: "LITHIUM" No results found for: "VALPROATE" No results found for: "CBMZ"  Current Medications: Current Outpatient Medications  Medication Sig Dispense Refill   acetaminophen (TYLENOL) 500 MG tablet Take 1 tablet (500 mg total) by mouth every 6 (six) hours as needed. 30 tablet 0   albuterol (ACCUNEB) 0.63 MG/3ML nebulizer solution Take 1 ampule by nebulization every 6 (six) hours as needed for wheezing or shortness of breath.  cetirizine (ZYRTEC) 10 MG tablet TAKE 1 TABLET BY MOUTH EVERYDAY AT BEDTIME 90 tablet 2   Cyanocobalamin (VITAMIN B-12) 1000 MCG SUBL Place under the tongue.     fluticasone (FLONASE) 50 MCG/ACT nasal spray Place 2 sprays into both nostrils daily. 16 g 6   ibuprofen (ADVIL) 600 MG tablet Take 1 tablet (600 mg total) by mouth every 6 (six) hours as needed for headache or cramping. 60 tablet 0   meclizine (ANTIVERT) 25 MG tablet Take 1 tablet (25 mg total) by mouth 3 (three) times daily as needed for dizziness. (Patient not taking: Reported on 04/22/2019) 30 tablet 0   Mefenamic Acid 250 MG CAPS 500mg  po at start of period symptoms and then 250mg  po q6h for up to 3 days (Patient not taking: Reported on 03/26/2019) 39 capsule 3   norethindrone (AYGESTIN) 5 MG tablet Take 2 tablets (10 mg total) by mouth in the morning and at bedtime. 60 tablet 3   Pseudoephedrine-Acetaminophen (SUDAFED SINUS PO) Take by mouth.     Vilazodone HCl (VIIBRYD) 40 MG TABS Take 1 tablet (40 mg total) by mouth daily. 30 tablet 2   Vilazodone HCl (VIIBRYD) 40 MG TABS Take 1 tablet (40 mg total) by mouth daily. 30 tablet 0   Vitamin D, Ergocalciferol, (DRISDOL) 1.25 MG (50000 UNIT) CAPS capsule TAKE 1 CAPSULE BY MOUTH EVERY 7 DAYS 12 capsule 1   No current facility-administered medications for this visit.      Musculoskeletal: Strength & Muscle Tone: within normal limits Gait & Station: normal Patient leans: N/A  Psychiatric Specialty Exam: Review of Systems  Psychiatric/Behavioral:  Negative for decreased concentration, dysphoric mood, hallucinations, self-injury, sleep disturbance and suicidal ideas. The patient is not nervous/anxious and is not hyperactive.     There were no vitals taken for this visit.There is no height or weight on file to calculate BMI.  General Appearance: Casual  Eye Contact:  Fair  Speech:  Clear and Coherent and Normal Rate  Volume:  Normal  Mood:  Anxious and Depressed  Affect:  Appropriate  Thought Process:  Coherent and Descriptions of Associations: Intact  Orientation:  Full (Time, Place, and Person)  Thought Content: WDL   Suicidal Thoughts:  No  Homicidal Thoughts:  No  Memory:  Immediate;   Good Recent;   Good Remote;   Good  Judgement:  Good  Insight:  Good  Psychomotor Activity:  Normal  Concentration:  Concentration: Good and Attention Span: Good  Recall:  Good  Fund of Knowledge: Good  Language: Good  Akathisia:  No  Handed:  Right  AIMS (if indicated): not done  Assets:  Communication Skills Desire for Improvement Housing Social Support Transportation Vocational/Educational  ADL's:  Intact  Cognition: WNL  Sleep:  Good   Screenings: GAD-7    Flowsheet Row Video Visit from 08/10/2022 in [facility] Video Visit from 06/26/2022 in [facility] Video Visit from 07/28/2021 in [facility] Video Visit from 04/28/2021 in [facility] Video Visit from 12/30/2020 in [facility]  Total GAD-7 Score 7 6 20 9 17       PHQ2-9    Flowsheet Row Video Visit from 08/10/2022 in [facility] Video Visit from 06/26/2022 in [facility] Video Visit from  07/28/2021 in [facility] Video Visit from 04/28/2021 in [facility] Video Visit from 12/30/2020 in [place]  [facility]  PHQ-2 Total Score 1 0 1 0 1      Flowsheet Row Video Visit from 08/10/2022 in [facility] Video Visit from 06/26/2022 in [facility] ED from 04/26/2022 in [facility]  C-SSRS RISK CATEGORY No Risk No Risk No Risk        Assessment and Plan:   Miette Molenda is a 27 year old, African-American female with a past psychiatric history significant for major depressive disorder versus bipolar affective disorder (depressed), generalized anxiety disorder, PTSD, and premenstrual dysphoric disorder who presents to [facility] for follow-up and medication management.  Patient reports no major issues or concerns at this time, despite not being on any medications at this time.  Patient endorses depressive episodes mostly attributed to grief but denies anything out of the ordinary.  Patient continues to endorse anxiety attributed to stressors in her life.  Patient continues to hold off on being placed on medications at this time.  Patient's mood to be evaluated during her next appointment.  Collaboration of Care: Collaboration of Care: Medication Management AEB provider managing patient's psychiatric medications and Psychiatrist AEB patient being followed by mental health provider at this facility  Patient/Guardian was advised Release of Information must be obtained prior to any record release in order to collaborate their care with an outside provider. Patient/Guardian was advised if they have not already done so to contact the registration department to sign all necessary forms in order for [place] to release information regarding their care.   Consent: Patient/Guardian gives verbal consent for  treatment and assignment of benefits for services provided during this visit. Patient/Guardian expressed understanding and agreed to proceed.   1. Generalized anxiety disorder   2. PTSD (post-traumatic stress disorder)   3. PMDD (premenstrual dysphoric disorder)   4. Mild episode of recurrent major depressive disorder [facility])   Patient to follow-up in 2 months Provider spent a total of 13 minutes with the patient/reviewing patient's chart  Meta Hatchet, PA 08/12/2022, 3:19 AM

## 2023-01-09 ENCOUNTER — Encounter: Payer: Self-pay | Admitting: Hematology
